# Patient Record
Sex: Female | Born: 1955 | Race: White | Smoking: Former smoker
Health system: Southern US, Community
[De-identification: ages and names within clinical notes are randomized; demographics above are authoritative.]

## PROBLEM LIST (undated history)

## (undated) DIAGNOSIS — E785 Hyperlipidemia, unspecified: Secondary | ICD-10-CM

## (undated) DIAGNOSIS — R011 Cardiac murmur, unspecified: Secondary | ICD-10-CM

## (undated) DIAGNOSIS — M8580 Other specified disorders of bone density and structure, unspecified site: Secondary | ICD-10-CM

## (undated) DIAGNOSIS — R7303 Prediabetes: Secondary | ICD-10-CM

## (undated) DIAGNOSIS — E538 Deficiency of other specified B group vitamins: Secondary | ICD-10-CM

## (undated) DIAGNOSIS — E559 Vitamin D deficiency, unspecified: Secondary | ICD-10-CM

## (undated) DIAGNOSIS — E669 Obesity, unspecified: Secondary | ICD-10-CM

## (undated) DIAGNOSIS — R7989 Other specified abnormal findings of blood chemistry: Secondary | ICD-10-CM

## (undated) DIAGNOSIS — R0989 Other specified symptoms and signs involving the circulatory and respiratory systems: Secondary | ICD-10-CM

## (undated) HISTORY — PX: TONSILECTOMY/ADENOIDECTOMY WITH MYRINGOTOMY: SHX6125

## (undated) HISTORY — DX: Obesity, unspecified: E66.9

## (undated) HISTORY — DX: Other specified disorders of bone density and structure, unspecified site: M85.80

## (undated) HISTORY — DX: Vitamin D deficiency, unspecified: E55.9

## (undated) HISTORY — PX: WISDOM TOOTH EXTRACTION: SHX21

## (undated) HISTORY — DX: Cardiac murmur, unspecified: R01.1

## (undated) HISTORY — PX: TUBAL LIGATION: SHX77

## (undated) HISTORY — DX: Other specified abnormal findings of blood chemistry: R79.89

## (undated) HISTORY — DX: Prediabetes: R73.03

## (undated) HISTORY — DX: Hyperlipidemia, unspecified: E78.5

## (undated) HISTORY — DX: Deficiency of other specified B group vitamins: E53.8

## (undated) HISTORY — DX: Other specified symptoms and signs involving the circulatory and respiratory systems: R09.89

---

## 1982-08-04 HISTORY — PX: OTHER SURGICAL HISTORY: SHX169

## 2009-08-04 DIAGNOSIS — M8580 Other specified disorders of bone density and structure, unspecified site: Secondary | ICD-10-CM

## 2009-08-04 HISTORY — DX: Other specified disorders of bone density and structure, unspecified site: M85.80

## 2013-07-04 HISTORY — PX: COLONOSCOPY: SHX174

## 2015-06-29 ENCOUNTER — Encounter: Payer: Self-pay | Admitting: Family Medicine

## 2015-10-14 DIAGNOSIS — R7303 Prediabetes: Secondary | ICD-10-CM

## 2015-10-14 DIAGNOSIS — R7989 Other specified abnormal findings of blood chemistry: Secondary | ICD-10-CM

## 2015-10-14 HISTORY — DX: Prediabetes: R73.03

## 2015-10-14 HISTORY — DX: Other specified abnormal findings of blood chemistry: R79.89

## 2016-11-01 DIAGNOSIS — R0989 Other specified symptoms and signs involving the circulatory and respiratory systems: Secondary | ICD-10-CM

## 2016-11-01 DIAGNOSIS — E669 Obesity, unspecified: Secondary | ICD-10-CM

## 2016-11-01 DIAGNOSIS — R011 Cardiac murmur, unspecified: Secondary | ICD-10-CM

## 2016-11-01 HISTORY — DX: Other specified symptoms and signs involving the circulatory and respiratory systems: R09.89

## 2016-11-01 HISTORY — DX: Cardiac murmur, unspecified: R01.1

## 2016-11-01 HISTORY — DX: Obesity, unspecified: E66.9

## 2016-11-07 ENCOUNTER — Encounter: Payer: Self-pay | Admitting: Family Medicine

## 2016-11-07 LAB — BASIC METABOLIC PANEL
BUN: 16 (ref 4–21)
CREATININE: 0.8 (ref 0.5–1.1)
Glucose: 85
Potassium: 4.8 (ref 3.4–5.3)
Sodium: 144 (ref 137–147)

## 2016-11-07 LAB — TSH: TSH: 1.78 (ref 0.41–5.90)

## 2016-11-07 LAB — CBC AND DIFFERENTIAL
HCT: 41 (ref 36–46)
Hemoglobin: 13.3 (ref 12.0–16.0)
Neutrophils Absolute: 2088
Platelets: 231 (ref 150–399)
WBC: 4.5

## 2016-11-07 LAB — LIPID PANEL
CHOLESTEROL: 227 — AB (ref 0–200)
HDL: 66 (ref 35–70)
LDL Cholesterol: 146
Triglycerides: 55 (ref 40–160)

## 2016-11-07 LAB — VITAMIN D 25 HYDROXY (VIT D DEFICIENCY, FRACTURES): Vit D, 25-Hydroxy: 31

## 2016-11-07 LAB — HEMOGLOBIN A1C: HEMOGLOBIN A1C: 5.4

## 2016-11-07 LAB — HEPATIC FUNCTION PANEL
ALT: 22 (ref 7–35)
AST: 24 (ref 13–35)
Bilirubin, Total: 0.7

## 2016-11-07 LAB — VITAMIN B12: Vitamin B-12: 511

## 2016-11-07 LAB — IRON,TIBC AND FERRITIN PANEL: Ferritin: 138

## 2018-05-05 ENCOUNTER — Encounter: Payer: Self-pay | Admitting: Family Medicine

## 2018-06-25 ENCOUNTER — Encounter: Payer: Self-pay | Admitting: Family Medicine

## 2018-06-25 ENCOUNTER — Ambulatory Visit (INDEPENDENT_AMBULATORY_CARE_PROVIDER_SITE_OTHER): Payer: BLUE CROSS/BLUE SHIELD | Admitting: Family Medicine

## 2018-06-25 VITALS — BP 128/62 | HR 69 | Temp 98.6°F | Ht 62.0 in | Wt 188.0 lb

## 2018-06-25 DIAGNOSIS — Z1159 Encounter for screening for other viral diseases: Secondary | ICD-10-CM

## 2018-06-25 DIAGNOSIS — E538 Deficiency of other specified B group vitamins: Secondary | ICD-10-CM

## 2018-06-25 DIAGNOSIS — Z0000 Encounter for general adult medical examination without abnormal findings: Secondary | ICD-10-CM

## 2018-06-25 DIAGNOSIS — Z862 Personal history of diseases of the blood and blood-forming organs and certain disorders involving the immune mechanism: Secondary | ICD-10-CM | POA: Diagnosis not present

## 2018-06-25 DIAGNOSIS — Z13220 Encounter for screening for lipoid disorders: Secondary | ICD-10-CM | POA: Diagnosis not present

## 2018-06-25 DIAGNOSIS — Z9189 Other specified personal risk factors, not elsewhere classified: Secondary | ICD-10-CM | POA: Diagnosis not present

## 2018-06-25 DIAGNOSIS — E559 Vitamin D deficiency, unspecified: Secondary | ICD-10-CM | POA: Diagnosis not present

## 2018-06-25 DIAGNOSIS — Z114 Encounter for screening for human immunodeficiency virus [HIV]: Secondary | ICD-10-CM | POA: Diagnosis not present

## 2018-06-25 DIAGNOSIS — E7800 Pure hypercholesterolemia, unspecified: Secondary | ICD-10-CM

## 2018-06-25 LAB — COMPREHENSIVE METABOLIC PANEL
ALT: 14 U/L (ref 0–35)
AST: 18 U/L (ref 0–37)
Albumin: 4.1 g/dL (ref 3.5–5.2)
Alkaline Phosphatase: 57 U/L (ref 39–117)
BUN: 19 mg/dL (ref 6–23)
CO2: 29 mEq/L (ref 19–32)
Calcium: 9.5 mg/dL (ref 8.4–10.5)
Chloride: 105 mEq/L (ref 96–112)
Creatinine, Ser: 0.89 mg/dL (ref 0.40–1.20)
GFR: 68.36 mL/min (ref >60.00)
Glucose, Bld: 133 mg/dL — ABNORMAL HIGH (ref 70–99)
Potassium: 4.2 mEq/L (ref 3.5–5.1)
Sodium: 142 mEq/L (ref 135–145)
Total Bilirubin: 0.4 mg/dL (ref 0.2–1.2)
Total Protein: 6.4 g/dL (ref 6.0–8.3)

## 2018-06-25 LAB — LIPID PANEL
Cholesterol: 225 mg/dL — ABNORMAL HIGH (ref 0–200)
HDL: 53.7 mg/dL (ref >39.00)
LDL Cholesterol: 142 mg/dL — ABNORMAL HIGH (ref 0–99)
NonHDL: 171.37
Total CHOL/HDL Ratio: 4
Triglycerides: 149 mg/dL (ref 0.0–149.0)
VLDL: 29.8 mg/dL (ref 0.0–40.0)

## 2018-06-25 LAB — CBC WITH DIFFERENTIAL/PLATELET
Basophils Absolute: 0 10*3/uL (ref 0.0–0.1)
Basophils Relative: 0.9 % (ref 0.0–3.0)
Eosinophils Absolute: 0.3 10*3/uL (ref 0.0–0.7)
Eosinophils Relative: 4.8 % (ref 0.0–5.0)
HCT: 39.1 % (ref 36.0–46.0)
Hemoglobin: 12.8 g/dL (ref 12.0–15.0)
Lymphocytes Relative: 39.4 % (ref 12.0–46.0)
Lymphs Abs: 2.1 10*3/uL (ref 0.7–4.0)
MCHC: 32.7 g/dL (ref 30.0–36.0)
MCV: 78.4 fl (ref 78.0–100.0)
Monocytes Absolute: 0.4 10*3/uL (ref 0.1–1.0)
Monocytes Relative: 6.9 % (ref 3.0–12.0)
Neutro Abs: 2.5 10*3/uL (ref 1.4–7.7)
Neutrophils Relative %: 48 % (ref 43.0–77.0)
Platelets: 241 10*3/uL (ref 150.0–400.0)
RBC: 4.99 Mil/uL (ref 3.87–5.11)
RDW: 13.8 % (ref 11.5–15.5)
WBC: 5.3 10*3/uL (ref 4.0–10.5)

## 2018-06-25 LAB — VITAMIN B12: Vitamin B-12: 313 pg/mL (ref 211–911)

## 2018-06-25 LAB — VITAMIN D 25 HYDROXY (VIT D DEFICIENCY, FRACTURES): VITD: 27.82 ng/mL — ABNORMAL LOW (ref 30.00–100.00)

## 2018-06-25 NOTE — Progress Notes (Signed)
Subjective:    Carla Washington is a 62 y.o. female and is here for a comprehensive physical exam.  Health Maintenance Due  Topic Date Due  . TETANUS/TDAP  09/15/1975  . PAP SMEAR  09/14/1977  . COLONOSCOPY  09/14/2006   No current outpatient medications on file.  PMHx, SurgHx, SocialHx, Medications, and Allergies were reviewed in the Visit Navigator and updated as appropriate.   History reviewed. No pertinent past medical history.   Past Surgical History:  Procedure Laterality Date  . CESAREAN SECTION     History reviewed. No pertinent family history.  Social History   Tobacco Use  . Smoking status: Former Smoker    Packs/day: 0.01    Last attempt to quit: 06/25/1993    Years since quitting: 25.0  . Smokeless tobacco: Never Used  Substance Use Topics  . Alcohol use: Never    Frequency: Never  . Drug use: Never    Review of Systems:   Pertinent items are noted in the HPI. Otherwise, ROS is negative.  Objective:   BP 128/62   Pulse 69   Temp 98.6 F (37 C) (Oral)   Ht 5\' 2"  (1.575 m)   Wt 188 lb (85.3 kg)   SpO2 97%   BMI 34.39 kg/m   General appearance: alert, cooperative and appears stated age. Head: normocephalic, without obvious abnormality, atraumatic. Neck: no adenopathy, supple, symmetrical, trachea midline; thyroid not enlarged, symmetric, no tenderness/mass/nodules. Lungs: clear to auscultation bilaterally. Heart: regular rate and rhythm Abdomen: soft, non-tender; no masses,  no organomegaly. Extremities: extremities normal, atraumatic, no cyanosis or edema. Skin: skin color, texture, turgor normal, no rashes or lesions. Lymph: cervical, supraclavicular, and axillary nodes normal; no abnormal inguinal nodes palpated. Neurologic: grossly normal.  Results for orders placed or performed in visit on 06/25/18  CBC with Differential/Platelet  Result Value Ref Range   WBC 5.3 4.0 - 10.5 K/uL   RBC 4.99 3.87 - 5.11 Mil/uL   Hemoglobin 12.8 12.0  - 15.0 g/dL   HCT 09.839.1 11.936.0 - 14.746.0 %   MCV 78.4 78.0 - 100.0 fl   MCHC 32.7 30.0 - 36.0 g/dL   RDW 82.913.8 56.211.5 - 13.015.5 %   Platelets 241.0 150.0 - 400.0 K/uL   Neutrophils Relative % 48.0 43.0 - 77.0 %   Lymphocytes Relative 39.4 12.0 - 46.0 %   Monocytes Relative 6.9 3.0 - 12.0 %   Eosinophils Relative 4.8 0.0 - 5.0 %   Basophils Relative 0.9 0.0 - 3.0 %   Neutro Abs 2.5 1.4 - 7.7 K/uL   Lymphs Abs 2.1 0.7 - 4.0 K/uL   Monocytes Absolute 0.4 0.1 - 1.0 K/uL   Eosinophils Absolute 0.3 0.0 - 0.7 K/uL   Basophils Absolute 0.0 0.0 - 0.1 K/uL  Comprehensive metabolic panel  Result Value Ref Range   Sodium 142 135 - 145 mEq/L   Potassium 4.2 3.5 - 5.1 mEq/L   Chloride 105 96 - 112 mEq/L   CO2 29 19 - 32 mEq/L   Glucose, Bld 133 (H) 70 - 99 mg/dL   BUN 19 6 - 23 mg/dL   Creatinine, Ser 8.650.89 0.40 - 1.20 mg/dL   Total Bilirubin 0.4 0.2 - 1.2 mg/dL   Alkaline Phosphatase 57 39 - 117 U/L   AST 18 0 - 37 U/L   ALT 14 0 - 35 U/L   Total Protein 6.4 6.0 - 8.3 g/dL   Albumin 4.1 3.5 - 5.2 g/dL   Calcium 9.5 8.4 -  10.5 mg/dL   GFR 16.10 >96.04 mL/min  Lipid panel  Result Value Ref Range   Cholesterol 225 (H) 0 - 200 mg/dL   Triglycerides 540.9 0.0 - 149.0 mg/dL   HDL 81.19 >14.78 mg/dL   VLDL 29.5 0.0 - 62.1 mg/dL   LDL Cholesterol 308 (H) 0 - 99 mg/dL   Total CHOL/HDL Ratio 4    NonHDL 171.37   VITAMIN D 25 Hydroxy (Vit-D Deficiency, Fractures)  Result Value Ref Range   VITD 27.82 (L) 30.00 - 100.00 ng/mL  Vitamin B12  Result Value Ref Range   Vitamin B-12 313 211 - 911 pg/mL  Iron, TIBC and Ferritin Panel  Result Value Ref Range   Iron 69 45 - 160 mcg/dL   TIBC 657 846 - 962 mcg/dL (calc)   %SAT 24 16 - 45 % (calc)   Ferritin 87 16 - 288 ng/mL  HIV Antibody (routine testing w rflx)  Result Value Ref Range   HIV 1&2 Ab, 4th Generation NON-REACTIVE NON-REACTI  Hepatitis C antibody  Result Value Ref Range   Hepatitis C Ab NON-REACTIVE NON-REACTI   SIGNAL TO CUT-OFF 0.01 <1.00     Assessment/Plan:   Tyyne was seen today for establish care.  Diagnoses and all orders for this visit:  Routine physical examination  Encounter for hepatitis C virus screening test for high risk patient -     Hepatitis C antibody  Screening for HIV (human immunodeficiency virus) -     HIV Antibody (routine testing w rflx)  Screening for lipid disorders -     Lipid panel  History of anemia -     CBC with Differential/Platelet -     Comprehensive metabolic panel -     Vitamin B12 -     Iron, TIBC and Ferritin Panel  Vitamin D deficiency -     VITAMIN D 25 Hydroxy (Vit-D Deficiency, Fractures)  Patient Counseling: [x]    Nutrition: Stressed importance of moderation in sodium/caffeine intake, saturated fat and cholesterol, caloric balance, sufficient intake of fresh fruits, vegetables, fiber, calcium, iron, and 1 mg of folate supplement per day (for females capable of pregnancy).  [x]    Stressed the importance of regular exercise.   [x]    Substance Abuse: Discussed cessation/primary prevention of tobacco, alcohol, or other drug use; driving or other dangerous activities under the influence; availability of treatment for abuse.   [x]    Injury prevention: Discussed safety belts, safety helmets, smoke detector, smoking near bedding or upholstery.   [x]    Sexuality: Discussed sexually transmitted diseases, partner selection, use of condoms, avoidance of unintended pregnancy  and contraceptive alternatives.  [x]    Dental health: Discussed importance of regular tooth brushing, flossing, and dental visits.  [x]    Health maintenance and immunizations reviewed. Please refer to Health maintenance section.   Helane Rima, DO [facility]

## 2018-06-26 LAB — HEPATITIS C ANTIBODY
Hepatitis C Ab: NONREACTIVE
SIGNAL TO CUT-OFF: 0.01 (ref <1.00)

## 2018-06-26 LAB — IRON,TIBC AND FERRITIN PANEL
%SAT: 24 % (calc) (ref 16–45)
Ferritin: 87 ng/mL (ref 16–288)
Iron: 69 ug/dL (ref 45–160)
TIBC: 291 mcg/dL (calc) (ref 250–450)

## 2018-06-26 LAB — HIV ANTIBODY (ROUTINE TESTING W REFLEX): HIV 1&2 Ab, 4th Generation: NONREACTIVE

## 2018-06-27 ENCOUNTER — Encounter: Payer: Self-pay | Admitting: Family Medicine

## 2018-06-27 DIAGNOSIS — E559 Vitamin D deficiency, unspecified: Secondary | ICD-10-CM

## 2018-06-27 DIAGNOSIS — E538 Deficiency of other specified B group vitamins: Secondary | ICD-10-CM

## 2018-06-27 DIAGNOSIS — E785 Hyperlipidemia, unspecified: Secondary | ICD-10-CM

## 2018-06-27 HISTORY — DX: Hyperlipidemia, unspecified: E78.5

## 2018-06-27 HISTORY — DX: Vitamin D deficiency, unspecified: E55.9

## 2018-06-27 HISTORY — DX: Deficiency of other specified B group vitamins: E53.8

## 2018-06-30 ENCOUNTER — Encounter: Payer: Self-pay | Admitting: Family Medicine

## 2018-07-07 ENCOUNTER — Encounter: Payer: Self-pay | Admitting: Family Medicine

## 2018-10-24 ENCOUNTER — Telehealth: Payer: BLUE CROSS/BLUE SHIELD | Admitting: Nurse Practitioner

## 2018-10-24 ENCOUNTER — Encounter: Payer: Self-pay | Admitting: Family Medicine

## 2018-10-24 DIAGNOSIS — R05 Cough: Secondary | ICD-10-CM

## 2018-10-24 DIAGNOSIS — R059 Cough, unspecified: Secondary | ICD-10-CM

## 2018-10-24 MED ORDER — BENZONATATE 100 MG PO CAPS: 100.0000 mg | ORAL_CAPSULE | Freq: Three times a day (TID) | ORAL | 0 refills | Status: DC | PRN

## 2018-10-24 NOTE — Progress Notes (Signed)
E-Visit for Corona Virus Screening  Based on your current symptoms, you may very well have the virus, however your symptoms are mild. Currently, not all patients are being tested. If the symptoms are mild and there is not a known exposure, performing the test is not indicated.  Coronavirus disease 2019 (COVID-19) is a respiratory illness that can spread from person to person. The virus that causes COVID-19 is a new virus that was first identified in the country of [place] but is now found in multiple other countries and has spread to the [place].  Symptoms associated with the virus are mild to severe fever, cough, and shortness of breath. There is currently no vaccine to protect against COVID-19, and there is no specific antiviral treatment for the virus.   To be considered HIGH RISK for Coronavirus (COVID-19), you have to meet the following criteria:  . Traveled to [place], [place], [place], [place] or [place]; or in the [place] to [place], [place], [place], or [place]; and have fever, cough, and shortness of breath within the last 2 weeks of travel OR  . Been in close contact with a person diagnosed with COVID-19 within the last 2 weeks and have fever, cough, and shortness of breath  . IF YOU DO NOT MEET THESE CRITERIA, YOU ARE CONSIDERED LOW RISK FOR COVID-19.   It is vitally important that if you feel that you have an infection such as this virus or any other virus that you stay home and away from places where you may spread it to others.  You should self-quarantine for 14 days if you have symptoms that could potentially be coronavirus and avoid contact with people age 65 and older.   You can use medication such as A prescription cough medication called Tessalon Perles 100 mg. You may take 1-2 capsules every 8 hours as needed for cough  You may also take acetaminophen (Tylenol) as needed for fever.   Reduce your risk of any infection by using the same precautions used for  avoiding the common cold or flu:  . Wash your hands often with soap and warm water for at least 20 seconds.  If soap and water are not readily available, use an alcohol-based hand sanitizer with at least 60% alcohol.  . If coughing or sneezing, cover your mouth and nose by coughing or sneezing into the elbow areas of your shirt or coat, into a tissue or into your sleeve (not your hands). . Avoid shaking hands with others and consider head nods or verbal greetings only. . Avoid touching your eyes, nose, or mouth with unwashed hands.  . Avoid close contact with people who are sick. . Avoid places or events with large numbers of people in one location, like concerts or sporting events. . Carefully consider travel plans you have or are making. . If you are planning any travel outside or inside the [place], visit the CDC's Travelers' Health webpage for the latest health notices. . If you have some symptoms but not all symptoms, continue to monitor at home and seek medical attention if your symptoms worsen. . If you are having a medical emergency, call 911.  HOME CARE . Only take medications as instructed by your medical team. . Drink plenty of fluids and get plenty of rest. . A steam or ultrasonic humidifier can help if you have congestion.   GET HELP RIGHT AWAY IF: . You develop worsening fever. . You become short of breath . You cough   up blood. . Your symptoms become more severe MAKE SURE YOU   Understand these instructions.  Will watch your condition.  Will get help right away if you are not doing well or get worse.  Your e-visit answers were reviewed by a board certified advanced clinical practitioner to complete your personal care plan.  Depending on the condition, your plan could have included both over the counter or prescription medications.  If there is a problem please reply once you have received a response from your provider. Your safety is important to us.  If you have drug  allergies check your prescription carefully.    You can use MyChart to ask questions about today's visit, request a non-urgent call back, or ask for a work or school excuse for 24 hours related to this e-Visit. If it has been greater than 24 hours you will need to follow up with your provider, or enter a new e-Visit to address those concerns. You will get an e-mail in the next two days asking about your experience.  I hope that your e-visit has been valuable and will speed your recovery. Thank you for using e-visits.  5 minutes spent reviewing and documenting in chart.  

## 2019-03-10 DIAGNOSIS — M2042 Other hammer toe(s) (acquired), left foot: Secondary | ICD-10-CM | POA: Diagnosis not present

## 2019-03-10 DIAGNOSIS — S9032XA Contusion of left foot, initial encounter: Secondary | ICD-10-CM | POA: Diagnosis not present

## 2019-03-10 DIAGNOSIS — M2041 Other hammer toe(s) (acquired), right foot: Secondary | ICD-10-CM | POA: Diagnosis not present

## 2019-06-16 DIAGNOSIS — R002 Palpitations: Secondary | ICD-10-CM | POA: Diagnosis not present

## 2019-06-16 DIAGNOSIS — E785 Hyperlipidemia, unspecified: Secondary | ICD-10-CM | POA: Diagnosis not present

## 2019-06-16 DIAGNOSIS — R0683 Snoring: Secondary | ICD-10-CM | POA: Insufficient documentation

## 2019-06-27 ENCOUNTER — Encounter: Payer: Self-pay | Admitting: Family Medicine

## 2019-07-15 ENCOUNTER — Ambulatory Visit (INDEPENDENT_AMBULATORY_CARE_PROVIDER_SITE_OTHER): Payer: BC Managed Care – PPO | Admitting: Family Medicine

## 2019-07-15 ENCOUNTER — Encounter: Payer: Self-pay | Admitting: Family Medicine

## 2019-07-15 ENCOUNTER — Other Ambulatory Visit: Payer: Self-pay

## 2019-07-15 VITALS — BP 106/74 | HR 60 | Temp 97.3°F | Ht 62.0 in | Wt 200.6 lb

## 2019-07-15 DIAGNOSIS — Z0000 Encounter for general adult medical examination without abnormal findings: Secondary | ICD-10-CM

## 2019-07-15 DIAGNOSIS — E559 Vitamin D deficiency, unspecified: Secondary | ICD-10-CM

## 2019-07-15 DIAGNOSIS — E538 Deficiency of other specified B group vitamins: Secondary | ICD-10-CM | POA: Diagnosis not present

## 2019-07-15 NOTE — Patient Instructions (Signed)

## 2019-07-15 NOTE — Progress Notes (Signed)
Patient: Carla Washington MRN: [id number] DOB: May 03, 1956 PCP: Briscoe Deutscher, DO     Subjective:  Chief Complaint  Patient presents with  . Transitions Of Care  . Annual Exam    HPI: The patient is a 63 y.o. female who presents today for annual exam. She denies any changes to past medical history. There have been no recent hospitalizations. They are following a well balanced diet and exercise plan. Weight has been increasing steadily. No complaints today.   No family history of breast or colon cancer in first degree relative.   Saw a cardiologist through [facility] recently. He is doing an echo stress exercise test on 08/15/18. He also has addressed cholesterol with her. +FH of deadly MI in her father at age 62 years. Mother was 67 years of age and died of alzheimers.   There is no immunization history on file for this patient.  Colonoscopy:07/2013 Mammogram: has been 4 years... she does do self exams.  Pap smear: 10/20/2016 Flu shot: declines Tdap: declines  Review of Systems  Constitutional: Negative for fatigue.  HENT: Positive for rhinorrhea. Negative for congestion, postnasal drip and sore throat.   Eyes: Negative for visual disturbance.  Respiratory: Negative for shortness of breath.   Cardiovascular: Positive for palpitations. Negative for chest pain and leg swelling.       Palpitations "for years" Followed by Cardiology for this.  Scheduled to have stress test in 08/2019  Gastrointestinal: Negative for abdominal pain, constipation, diarrhea, nausea and vomiting.  Endocrine: Negative for cold intolerance, heat intolerance, polydipsia and polyuria.  Genitourinary: Negative for dysuria, frequency and urgency.  Musculoskeletal: Negative for arthralgias, back pain, myalgias and neck pain.  Skin: Negative for rash.  Neurological: Negative for dizziness and headaches.  Psychiatric/Behavioral: Negative for sleep disturbance.    Allergies Patient is allergic to  penicillins.  Past Medical History Patient  has a past medical history of Abnormal C-reactive protein (10/14/2015), B12 deficiency (06/27/2018), Carotid bruit (11/01/2016), HLD (hyperlipidemia) (06/27/2018), Obesity (11/01/2016), Osteopenia (08/04/2009), Prediabetes (32/35/5732), Systolic murmur (20/25/4270), and Vitamin D deficiency (06/27/2018).  Surgical History Patient  has a past surgical history that includes Cesarean section; Tonsilectomy/adenoidectomy with myringotomy; Colonoscopy (07/04/2013); and removal of ovarian cyst (08/04/1982).  Family History Pateint's family history includes Alzheimer's disease in her mother; Heart disease in her father.  Social History Patient  reports that she quit smoking about 26 years ago. She smoked 0.01 packs per day. She has never used smokeless tobacco. She reports that she does not drink alcohol or use drugs.    Objective: Vitals:   07/15/19 1305  BP: 106/74  Pulse: 60  Temp: (!) 97.3 F (36.3 C)  TempSrc: Skin  SpO2: 98%  Weight: 200 lb 9.6 oz (91 kg)  Height: 5\' 2"  (1.575 m)    Body mass index is 36.69 kg/m.  Physical Exam Vitals reviewed.  Constitutional:      Appearance: Normal appearance. She is well-developed. She is obese.  HENT:     Head: Normocephalic and atraumatic.     Right Ear: Tympanic membrane, ear canal and external ear normal.     Left Ear: Tympanic membrane, ear canal and external ear normal.     Ears:     Comments: Scarring bilateral TM    Nose: Nose normal.     Mouth/Throat:     Mouth: Mucous membranes are moist.  Eyes:     Conjunctiva/sclera: Conjunctivae normal.     Pupils: Pupils are equal, round, and reactive to light.  Neck:  Thyroid: No thyromegaly.     Vascular: No carotid bruit.  Cardiovascular:     Rate and Rhythm: Normal rate and regular rhythm.     Pulses: Normal pulses.     Heart sounds: Normal heart sounds. No murmur.  Pulmonary:     Effort: Pulmonary effort is normal.     Breath  sounds: Normal breath sounds.  Abdominal:     General: Bowel sounds are normal. There is no distension.     Palpations: Abdomen is soft.     Tenderness: There is no abdominal tenderness.  Musculoskeletal:     Cervical back: Normal range of motion and neck supple.  Lymphadenopathy:     Cervical: No cervical adenopathy.  Skin:    General: Skin is warm and dry.     Capillary Refill: Capillary refill takes less than 2 seconds.     Findings: No rash.  Neurological:     General: No focal deficit present.     Mental Status: She is alert and oriented to person, place, and time.     Cranial Nerves: No cranial nerve deficit.     Coordination: Coordination normal.     Deep Tendon Reflexes: Reflexes normal.  Psychiatric:        Mood and Affect: Mood normal.        Behavior: Behavior normal.    Depression screen [facility] 2/9 07/15/2019  Decreased Interest 0  Down, Depressed, Hopeless 0  PHQ - 2 Score 0       Assessment/plan: 1. Annual physical exam Routine fasting labs. She declines a lot of the HM issues. Understands risks. Will come back for labs. Followed by cardiology for palpitations. Continue to work on weight loss and exercise.  Patient counseling [x]    Nutrition: Stressed importance of moderation in sodium/caffeine intake, saturated fat and cholesterol, caloric balance, sufficient intake of fresh fruits, vegetables, fiber, calcium, iron, and 1 mg of folate supplement per day (for females capable of pregnancy).  [x]    Stressed the importance of regular exercise.   []    Substance Abuse: Discussed cessation/primary prevention of tobacco, alcohol, or other drug use; driving or other dangerous activities under the influence; availability of treatment for abuse.   [x]    Injury prevention: Discussed safety belts, safety helmets, smoke detector, smoking near bedding or upholstery.   [x]    Sexuality: Discussed sexually transmitted diseases, partner selection, use of condoms, avoidance of  unintended pregnancy  and contraceptive alternatives.  [x]    Dental health: Discussed importance of regular tooth brushing, flossing, and dental visits.  [x]    Health maintenance and immunizations reviewed. Please refer to Health maintenance section.     2. B12 deficiency Recheck   3. Vitamin D deficiency Recheck   This visit occurred during the SARS-CoV-2 public health emergency.  Safety protocols were in place, including screening questions prior to the visit, additional usage of staff PPE, and extensive cleaning of exam room while observing appropriate contact time as indicated for disinfecting solutions.    Return in about 1 year (around 07/14/2020).    , MD [facility]  07/15/2019

## 2019-07-18 ENCOUNTER — Other Ambulatory Visit: Payer: BC Managed Care – PPO

## 2019-12-16 ENCOUNTER — Encounter: Payer: Self-pay | Admitting: Family Medicine

## 2019-12-16 DIAGNOSIS — Z20822 Contact with and (suspected) exposure to covid-19: Secondary | ICD-10-CM | POA: Diagnosis not present

## 2019-12-18 DIAGNOSIS — L246 Irritant contact dermatitis due to food in contact with skin: Secondary | ICD-10-CM | POA: Diagnosis not present

## 2019-12-19 ENCOUNTER — Other Ambulatory Visit: Payer: Self-pay | Admitting: Family Medicine

## 2019-12-19 DIAGNOSIS — E782 Mixed hyperlipidemia: Secondary | ICD-10-CM

## 2019-12-19 DIAGNOSIS — E538 Deficiency of other specified B group vitamins: Secondary | ICD-10-CM

## 2019-12-19 DIAGNOSIS — E559 Vitamin D deficiency, unspecified: Secondary | ICD-10-CM

## 2019-12-20 ENCOUNTER — Other Ambulatory Visit (INDEPENDENT_AMBULATORY_CARE_PROVIDER_SITE_OTHER): Payer: BC Managed Care – PPO

## 2019-12-20 ENCOUNTER — Other Ambulatory Visit: Payer: Self-pay

## 2019-12-20 DIAGNOSIS — E538 Deficiency of other specified B group vitamins: Secondary | ICD-10-CM

## 2019-12-20 DIAGNOSIS — E782 Mixed hyperlipidemia: Secondary | ICD-10-CM | POA: Diagnosis not present

## 2019-12-20 DIAGNOSIS — E559 Vitamin D deficiency, unspecified: Secondary | ICD-10-CM | POA: Diagnosis not present

## 2019-12-20 LAB — LIPID PANEL
Cholesterol: 225 mg/dL — ABNORMAL HIGH (ref 0–200)
HDL: 50.3 mg/dL (ref >39.00)
LDL Cholesterol: 155 mg/dL — ABNORMAL HIGH (ref 0–99)
NonHDL: 175.1
Total CHOL/HDL Ratio: 4
Triglycerides: 102 mg/dL (ref 0.0–149.0)
VLDL: 20.4 mg/dL (ref 0.0–40.0)

## 2019-12-20 LAB — CBC WITH DIFFERENTIAL/PLATELET
Basophils Absolute: 0 10*3/uL (ref 0.0–0.1)
Basophils Relative: 0.8 % (ref 0.0–3.0)
Eosinophils Absolute: 0.2 10*3/uL (ref 0.0–0.7)
Eosinophils Relative: 3.4 % (ref 0.0–5.0)
HCT: 40.5 % (ref 36.0–46.0)
Hemoglobin: 13.2 g/dL (ref 12.0–15.0)
Lymphocytes Relative: 49.3 % — ABNORMAL HIGH (ref 12.0–46.0)
Lymphs Abs: 2.6 10*3/uL (ref 0.7–4.0)
MCHC: 32.6 g/dL (ref 30.0–36.0)
MCV: 78.8 fl (ref 78.0–100.0)
Monocytes Absolute: 0.5 10*3/uL (ref 0.1–1.0)
Monocytes Relative: 8.9 % (ref 3.0–12.0)
Neutro Abs: 2 10*3/uL (ref 1.4–7.7)
Neutrophils Relative %: 37.6 % — ABNORMAL LOW (ref 43.0–77.0)
Platelets: 206 10*3/uL (ref 150.0–400.0)
RBC: 5.14 Mil/uL — ABNORMAL HIGH (ref 3.87–5.11)
RDW: 14.1 % (ref 11.5–15.5)
WBC: 5.2 10*3/uL (ref 4.0–10.5)

## 2019-12-20 LAB — COMPREHENSIVE METABOLIC PANEL
ALT: 10 U/L (ref 0–35)
AST: 15 U/L (ref 0–37)
Albumin: 4.3 g/dL (ref 3.5–5.2)
Alkaline Phosphatase: 44 U/L (ref 39–117)
BUN: 23 mg/dL (ref 6–23)
CO2: 30 mEq/L (ref 19–32)
Calcium: 9.6 mg/dL (ref 8.4–10.5)
Chloride: 103 mEq/L (ref 96–112)
Creatinine, Ser: 0.81 mg/dL (ref 0.40–1.20)
GFR: 71.35 mL/min (ref >60.00)
Glucose, Bld: 92 mg/dL (ref 70–99)
Potassium: 4.2 mEq/L (ref 3.5–5.1)
Sodium: 138 mEq/L (ref 135–145)
Total Bilirubin: 0.5 mg/dL (ref 0.2–1.2)
Total Protein: 6.6 g/dL (ref 6.0–8.3)

## 2019-12-21 LAB — VITAMIN B12: Vitamin B-12: 482 pg/mL (ref 211–911)

## 2019-12-21 LAB — VITAMIN D 25 HYDROXY (VIT D DEFICIENCY, FRACTURES): VITD: 95.22 ng/mL (ref 30.00–100.00)

## 2019-12-22 ENCOUNTER — Other Ambulatory Visit: Payer: Self-pay

## 2019-12-22 ENCOUNTER — Ambulatory Visit (INDEPENDENT_AMBULATORY_CARE_PROVIDER_SITE_OTHER): Payer: BC Managed Care – PPO | Admitting: Family Medicine

## 2019-12-22 ENCOUNTER — Encounter: Payer: Self-pay | Admitting: Family Medicine

## 2019-12-22 VITALS — BP 112/80 | HR 76 | Temp 97.6°F | Ht 62.0 in | Wt 188.6 lb

## 2019-12-22 DIAGNOSIS — L309 Dermatitis, unspecified: Secondary | ICD-10-CM

## 2019-12-22 MED ORDER — EPINEPHRINE 0.3 MG/0.3ML IJ SOAJ: 0.3000 mg | INTRAMUSCULAR | 0 refills | Status: DC | PRN

## 2019-12-22 MED ORDER — TRIAMCINOLONE ACETONIDE 0.1 % EX CREA
1.0000 | TOPICAL_CREAM | Freq: Two times a day (BID) | CUTANEOUS | 0 refills | Status: DC
Start: 2019-12-22 — End: 2020-11-07

## 2019-12-22 NOTE — Patient Instructions (Signed)
1) I sent in steroid cream to use on legs/arms/hands. Do not use on face!   2) you can use hydrocortisone cream over the counter on face  3) never use neosporin! Can cause contact dermatitis. If need an antibiotic use polysporin.   4) labs all good. Work on cardio to get LDL lower.   Overall look great!!!  Dr. Artis Flock
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
## 2020-03-26 ENCOUNTER — Encounter: Payer: Self-pay | Admitting: Family Medicine

## 2020-04-02 ENCOUNTER — Encounter: Payer: Self-pay | Admitting: Family Medicine

## 2020-04-04 ENCOUNTER — Other Ambulatory Visit: Payer: Self-pay

## 2020-04-04 ENCOUNTER — Ambulatory Visit (INDEPENDENT_AMBULATORY_CARE_PROVIDER_SITE_OTHER): Payer: BC Managed Care – PPO | Admitting: Family Medicine

## 2020-04-04 ENCOUNTER — Encounter: Payer: Self-pay | Admitting: Family Medicine

## 2020-04-04 VITALS — BP 124/78 | HR 62 | Temp 98.0°F | Ht 62.0 in | Wt 198.0 lb

## 2020-04-04 DIAGNOSIS — M7121 Synovial cyst of popliteal space [Baker], right knee: Secondary | ICD-10-CM

## 2020-04-04 LAB — D-DIMER, QUANTITATIVE: D-Dimer, Quant: 0.51 mcg/mL FEU — ABNORMAL HIGH (ref <0.50)

## 2020-04-04 NOTE — Progress Notes (Signed)
Patient: Carla Washington MRN: [id number] DOB: 09/12/1955 PCP: Orland Mustard, MD     Subjective:  Chief Complaint  Patient presents with  . Knee Pain  . Joint Swelling    HPI: The patient is a 64 y.o. female who presents today for right knee pain, and calf swelling.  She states this started about one week ago. She has never had an issue with her knee before. Last Wednesday she was cutting bushes and got bit my mosquitoes and remembers scratching. The next day she woke up with a stiff and sore knee. She also states it got swollen. She has done ice and heat, naproxen every 8 hours. She has no pain sitting down. She has soreness behind her knee and in the front. She has pain with extension and flexion. She has heard popping sounds. She did go for a walk around her block last night and noticed her knee was really swollen after the walk. She has no past trauma or surgery to the knee. She has no hx of smoking, OCP, lupus, or long travel.   She has an appointment Tuesday with a DO.    Review of Systems  Cardiovascular: Positive for leg swelling.  Musculoskeletal: Positive for arthralgias and joint swelling.    Allergies Patient is allergic to penicillins.  Past Medical History Patient  has a past medical history of Abnormal C-reactive protein (10/14/2015), B12 deficiency (06/27/2018), Carotid bruit (11/01/2016), HLD (hyperlipidemia) (06/27/2018), Obesity (11/01/2016), Osteopenia (08/04/2009), Prediabetes (10/14/2015), Systolic murmur (75/64/3329), and Vitamin D deficiency (06/27/2018).  Surgical History Patient  has a past surgical history that includes Cesarean section; Tonsilectomy/adenoidectomy with myringotomy; Colonoscopy (07/04/2013); and removal of ovarian cyst (08/04/1982).  Family History Pateint's family history includes Alzheimer's disease in her mother; Heart disease in her father.  Social History Patient  reports that she quit smoking about 26 years ago. She smoked 0.01  packs per day. She has never used smokeless tobacco. She reports that she does not drink alcohol and does not use drugs.    Objective: Vitals:   04/04/20 1358  BP: 124/78  Pulse: 62  Temp: 98 F (36.7 C)  TempSrc: Temporal  SpO2: 99%  Weight: 198 lb (89.8 kg)  Height: 5\' 2"  (1.575 m)    Body mass index is 36.21 kg/m.  Physical Exam Vitals reviewed.  Constitutional:      General: She is not in acute distress.    Appearance: Normal appearance. She is obese. She is not ill-appearing.  HENT:     Head: Normocephalic and atraumatic.  Cardiovascular:     Rate and Rhythm: Normal rate and regular rhythm.  Pulmonary:     Effort: Pulmonary effort is normal.     Breath sounds: Normal breath sounds.  Musculoskeletal:        General: Swelling and tenderness present.     Comments: Right knee: TTP in popliteal fossa with edema. No TTP over anterior knee. Negative draw, negative mcmurrays. No pain with varus or valgus strain. Negative homan sign.  Edema from knee to ankle. No erythema or warmth. She can extend to just about 180 degrees with some pain in popliteal fossa and can flex past 90 degrees.  Left calf: 16: right calf 17.2"  Neurological:     General: No focal deficit present.     Mental Status: She is alert and oriented to person, place, and time.  Psychiatric:        Mood and Affect: Mood normal.        Behavior:  Behavior normal.    Wells criteria: -1    Assessment/plan: 1. Baker's cyst of knee, right Very low clinical suspicion for DVT. -1 wells criteria. Will check stat d-dimer. I do think she has a bakers cyst. Does not want xray as she already has appointment scheduled with sports medicine on Tuesday. Will do pull on brace/ice and voltaren gel. F/u with sports med. If d-dimer elevated will send for stat ultrasound today or tomorrow.  - D-Dimer, Quantitative; Future    This visit occurred during the SARS-CoV-2 public health emergency.  Safety protocols were in place,  including screening questions prior to the visit, additional usage of staff PPE, and extensive cleaning of exam room while observing appropriate contact time as indicated for disinfecting solutions.     Return if symptoms worsen or fail to improve.   Orland Mustard, MD [facility]   04/04/2020

## 2020-04-04 NOTE — Patient Instructions (Addendum)
voltaren gel over the counter four times a day Get a neoprene brace as well  Ordering stat lab that will help with clotting. Score for dvt is very low. If this test is elevated we will send for stat ultrasound.   Baker Cyst  A Baker cyst, also called a popliteal cyst, is a growth that forms at the back of the knee. The cyst forms when the fluid-filled sac (bursa) that cushions the knee joint becomes enlarged. What are the causes? In most cases, a Baker cyst results from another knee problem that causes swelling inside the knee. This makes the fluid inside the knee joint (synovial fluid) flow into the bursa behind the knee, causing the bursa to enlarge. What increases the risk? You may be more likely to develop a Baker cyst if you already have a knee problem, such as:  A tear in cartilage that cushions the knee joint (meniscal tear).  A tear in the tissues that connect the bones of the knee joint (ligament tear).  Knee swelling from osteoarthritis, rheumatoid arthritis, or gout. What are the signs or symptoms? The main symptom of this condition is a lump behind the knee. This may be the only symptom of the condition. The lump may be painful, especially when the knee is straightened. If the lump is painful, the pain may come and go. The knee may also be stiff. Symptoms may quickly get more severe if the cyst breaks open (ruptures). If the cyst ruptures, you may feel the following in your knee and calf:  Sudden or worsening pain.  Swelling.  Bruising.  Redness in the calf. A Baker cyst does not always cause symptoms. How is this diagnosed? This condition may be diagnosed based on your symptoms and medical history. Your health care provider will also do a physical exam. This may include:  Feeling the cyst to check whether it is tender.  Checking your knee for signs of another knee condition that causes swelling. You may have imaging tests, such  as:  X-rays.  MRI.  Ultrasound. How is this treated? A Baker cyst that is not painful may go away without treatment. If the cyst gets large or painful, it will likely get better if the underlying knee problem is treated. If needed, treatment for a Baker cyst may include:  Resting.  Keeping weight off of the knee. This means not leaning on the knee to support your body weight.  Taking NSAIDs, such as ibuprofen, to reduce pain and swelling.  Having a procedure to drain the fluid from the cyst with a needle (aspiration). You may also get an injection of a medicine that reduces swelling (steroid).  Having surgery. This may be needed if other treatments do not work. This usually involves correcting knee damage and removing the cyst. Follow these instructions at home:  Activity  Rest as told by your health care provider.  Avoid activities that make pain or swelling worse.  Return to your normal activities as told by your health care provider. Ask your health care provider what activities are safe for you.  Do not use the injured limb to support your body weight until your health care provider says that you can. Use crutches as told by your health care provider. General instructions  Take over-the-counter and prescription medicines only as told by your health care provider.  Keep all follow-up visits as told by your health care provider. This is important. Contact a health care provider if:  You have knee pain,  stiffness, or swelling that does not get better. Get help right away if:  You have sudden or worsening pain and swelling in your calf area. Summary  A Baker cyst, also called a popliteal cyst, is a growth that forms at the back of the knee.  In most cases, a Baker cyst results from another knee problem that causes swelling inside the knee.  A Baker cyst that is not painful may go away without treatment.  If needed, treatment for a Baker cyst may include resting,  keeping weight off of the knee, medicines, or draining fluid from the cyst.  Surgery may be needed if other treatments are not effective. This information is not intended to replace advice given to you by your health care provider. Make sure you discuss any questions you have with your health care provider. Document Revised: 12/03/2018 Document Reviewed: 12/03/2018 Elsevier Patient Education  2020 [employer].

## 2020-04-05 ENCOUNTER — Telehealth: Payer: Self-pay

## 2020-04-05 NOTE — Telephone Encounter (Signed)
Pt returned my call for lab results. Gave lab result message via Dr Blair Heys note. Pt voiced understanding, and will monitor for redness and swelling.

## 2020-04-05 NOTE — Telephone Encounter (Signed)
Lvm for pt to call the office back for lab results.

## 2020-04-06 ENCOUNTER — Other Ambulatory Visit: Payer: Self-pay | Admitting: Family Medicine

## 2020-04-06 ENCOUNTER — Ambulatory Visit (HOSPITAL_BASED_OUTPATIENT_CLINIC_OR_DEPARTMENT_OTHER)
Admission: RE | Admit: 2020-04-06 | Discharge: 2020-04-06 | Disposition: A | Discharge status: Home / Self Care | Payer: BC Managed Care – PPO | Source: Ambulatory Visit | Attending: Family Medicine | Admitting: Family Medicine

## 2020-04-06 ENCOUNTER — Emergency Department (HOSPITAL_BASED_OUTPATIENT_CLINIC_OR_DEPARTMENT_OTHER)
Admission: EM | Admit: 2020-04-06 | Discharge: 2020-04-06 | Disposition: A | Discharge status: Home / Self Care | Payer: BC Managed Care – PPO

## 2020-04-06 ENCOUNTER — Other Ambulatory Visit: Payer: Self-pay

## 2020-04-06 ENCOUNTER — Ambulatory Visit (HOSPITAL_COMMUNITY): Admission: RE | Admit: 2020-04-06 | Payer: BC Managed Care – PPO | Source: Ambulatory Visit

## 2020-04-06 DIAGNOSIS — R600 Localized edema: Secondary | ICD-10-CM | POA: Diagnosis not present

## 2020-04-06 DIAGNOSIS — M7989 Other specified soft tissue disorders: Secondary | ICD-10-CM | POA: Diagnosis not present

## 2020-04-06 DIAGNOSIS — M7121 Synovial cyst of popliteal space [Baker], right knee: Secondary | ICD-10-CM | POA: Diagnosis not present

## 2020-04-06 DIAGNOSIS — R2241 Localized swelling, mass and lump, right lower limb: Secondary | ICD-10-CM

## 2020-04-10 ENCOUNTER — Ambulatory Visit (HOSPITAL_COMMUNITY): Payer: BC Managed Care – PPO

## 2020-04-10 ENCOUNTER — Ambulatory Visit: Payer: BC Managed Care – PPO | Admitting: Sports Medicine

## 2020-04-12 ENCOUNTER — Ambulatory Visit (INDEPENDENT_AMBULATORY_CARE_PROVIDER_SITE_OTHER): Payer: BC Managed Care – PPO | Admitting: Sports Medicine

## 2020-04-12 ENCOUNTER — Other Ambulatory Visit: Payer: Self-pay

## 2020-04-12 ENCOUNTER — Encounter: Payer: Self-pay | Admitting: Sports Medicine

## 2020-04-12 VITALS — BP 114/78 | Ht 62.0 in | Wt 187.0 lb

## 2020-04-12 DIAGNOSIS — M25561 Pain in right knee: Secondary | ICD-10-CM

## 2020-04-12 DIAGNOSIS — M7121 Synovial cyst of popliteal space [Baker], right knee: Secondary | ICD-10-CM | POA: Diagnosis not present

## 2020-04-12 MED ORDER — METHYLPREDNISOLONE ACETATE 40 MG/ML IJ SUSP
40.0000 mg | Freq: Once | INTRAMUSCULAR | Status: AC
  Administered 2020-04-12: 40 mg via INTRA_ARTICULAR

## 2020-04-12 NOTE — Progress Notes (Signed)
PCP: Orland Mustard, MD  Subjective:   HPI: Patient is a 64 y.o. female here for evaluation of right knee pain.  Patient reports that 2 to 3 weeks ago, she developed pain in her right knee.  She states that she has not had any known history of any knee problems has not had pain in the past.  She started having pain in the posterior aspect of her knee that radiated around to the lateral aspect of her knee.  She did not have any sudden injuries prior to the onset of her symptoms but her discomfort was acute in onset.  In an effort to alleviate her discomfort, she tried to stretch her leg but felt a pop in the popliteal fossa.   She then developed swelling in the calf and distal thigh.  She presented to her PCP who ordered an ultrasound to rule out DVT.  This did not show any signs of DVT but did show a 6 x 1 x 5 cm Baker's cyst.   Today, patient reports that she continues to have swelling and pain  Review of Systems:  Per HPI.   PMFSH, medications and smoking status reviewed.      Objective:  Physical Exam:  Gen: awake, alert, NAD, comfortable in exam room Pulm: breathing unlabored  Knee  Inspection: Right distal thigh with small amount of edema present.  No ecchymosis, swelling, Active ROM: Mildly decreased 0-140d.  Strength: 5/5 strength to resisted flexion/extension without pain  Patella: Negative patellar grind. No patellar facet tenderness. No apprehension. No proximal or distal patellar tendon tenderness to palpation. No quad tendon tenderness to palpation.  Tibia: No tibial plateau, tibial tuberosity tenderness.  Joint line: No joint line tenderness.  Popliteal: Mild popliteal tenderness and mass palpable. No insertional biceps femoris, semimembranosis, semitendinosis tenderness.  Thessaly's: Negative bilaterally for pain, catching  Lachmans: Stable bilaterally with firm endpoint  Anterior/Posterior drawer: Stable bilaterally Varus/valgus stress at 0, 15d: Negative for pain,  laxity  US examination of the knee: Quadriceps tendon visualized and without abnormality. Suprapatellar pouch shows a small effusion laterally. Patellar tendon visualized and without any abnormalities. Medial meniscus visualized and without abnormalities. Lateral meniscus visualized and without any abnormalities. Posterior knee shows a large complex cyst with some hyperechogenicity within the fluid.  The cyst does communicate laterally.  Impression: -Large Baker's cyst -Mild knee effusion     Assessment & Plan:  1.  Acute right knee pain 2.  Baker's cyst  Patient with acute knee pain and Baker's cyst found during ultrasound for DVT.  Ultrasound today continues to show a complex Baker's cyst.  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  We discussed options including aspiration of the cyst, corticosteroid injection into the knee joint, and watchful waiting.  Given the fact that this is a complex cyst,  we will start with a corticosteroid injection into the knee.  Patient was also provided a compression sleeve and she will continue on over-the-counter Aleve.  We will schedule a tentative follow-up in 2 weeks and I will place an order for x-rays of her knee.  If she does not notice any improvement with today's injection, then she will proceed with those x-rays prior to her follow-up visit.  We will determine further work-up and treatment based on those x-ray results.  Procedure Note: After informed written consent timeout was performed, patient was seated on exam table. Right knee was prepped with  alcohol swab and utilizing anteromedial approach, patient's right knee was injected intraarticularly with 3:1 bupivicaine: depomedrol. Patient tolerated the procedure well without immediate complications.     Guy Sandifer, MD [facility] 04/12/2020 3:52 PM   Patient seen and evaluated with  the sports medicine fellow.  I agree with the above plan of care.  Cortisone injection administered as above.  Follow-up in 2 weeks if still symptomatic.

## 2020-04-12 NOTE — Patient Instructions (Addendum)
Thank you for coming in to see [place] today.  You have a large Baker's cyst, and this is likely due to knee arthritis.  Please see below to review our plan for today's visit:   1.   You received a knee injection today, hopefully this will help resolve your pain and strengthen the Baker's cyst.  You should expect some soreness tomorrow, and then the steroid will start working over the next 2 to 3 days. 2.   You may continue to take naproxen or ibuprofen for pain. 3.   Please use a compression sleeve during the day 4.  Please go to get x-rays of your knee for further evaluation. 5.  Please follow-up with [place] in 2 weeks to reevaluate.   Please call the clinic at [phone] if your symptoms worsen or you have any concerns. It was our pleasure to serve you.       Dr. Guy Sandifer Dr. Margaretha Sheffield since that visit, patient has continued to have significant pain over his posterior arm just proximal to the elbow.  He also has significant bruising in this area and distally.  He has some very mild pain in the proximal forearm as well.  Since the visit since his last visit [facility]

## 2020-04-25 ENCOUNTER — Ambulatory Visit
Admission: RE | Admit: 2020-04-25 | Discharge: 2020-04-25 | Disposition: A | Discharge status: Home / Self Care | Payer: BC Managed Care – PPO | Source: Ambulatory Visit | Attending: Sports Medicine | Admitting: Sports Medicine

## 2020-04-25 DIAGNOSIS — M25561 Pain in right knee: Secondary | ICD-10-CM | POA: Diagnosis not present

## 2020-04-26 ENCOUNTER — Ambulatory Visit: Payer: BC Managed Care – PPO | Admitting: Sports Medicine

## 2020-04-30 ENCOUNTER — Other Ambulatory Visit: Payer: Self-pay

## 2020-04-30 ENCOUNTER — Telehealth: Payer: Self-pay | Admitting: Sports Medicine

## 2020-04-30 DIAGNOSIS — M25561 Pain in right knee: Secondary | ICD-10-CM

## 2020-04-30 NOTE — Telephone Encounter (Signed)
  I spoke with the patient on the phone today after reviewing x-rays of her right knee.  X-ray shows a paucity of degenerative change.  No explanation for her Baker's cyst.  Her pain did improve for couple days after her most recent cortisone injection but she is now beginning to experience sharp pain in the anterior knee.  She also describes the inability to completely extend the right knee.  At this point in time, I have recommended an MRI of the right knee specifically to rule out a meniscal tear which may be responsible for her Baker's cyst.  Phone follow-up with those results when available and we will delineate more definitive treatment based on those results.

## 2020-05-01 ENCOUNTER — Ambulatory Visit: Payer: BC Managed Care – PPO | Admitting: Sports Medicine

## 2020-05-07 ENCOUNTER — Encounter: Payer: Self-pay | Admitting: Family Medicine

## 2020-05-19 ENCOUNTER — Ambulatory Visit
Admission: RE | Admit: 2020-05-19 | Discharge: 2020-05-19 | Disposition: A | Discharge status: Home / Self Care | Payer: BC Managed Care – PPO | Source: Ambulatory Visit | Attending: Sports Medicine | Admitting: Sports Medicine

## 2020-05-19 ENCOUNTER — Other Ambulatory Visit: Payer: Self-pay

## 2020-05-19 DIAGNOSIS — M25561 Pain in right knee: Secondary | ICD-10-CM

## 2020-05-22 ENCOUNTER — Other Ambulatory Visit: Payer: Self-pay

## 2020-05-22 ENCOUNTER — Encounter: Payer: Self-pay | Admitting: Family Medicine

## 2020-05-22 DIAGNOSIS — M7121 Synovial cyst of popliteal space [Baker], right knee: Secondary | ICD-10-CM

## 2020-05-23 ENCOUNTER — Other Ambulatory Visit: Payer: Self-pay | Admitting: Sports Medicine

## 2020-05-23 ENCOUNTER — Other Ambulatory Visit: Payer: Self-pay

## 2020-05-23 ENCOUNTER — Telehealth: Payer: Self-pay | Admitting: Sports Medicine

## 2020-05-23 DIAGNOSIS — M7121 Synovial cyst of popliteal space [Baker], right knee: Secondary | ICD-10-CM

## 2020-05-23 NOTE — Telephone Encounter (Signed)
°  I spoke with the patient on the phone yesterday after reviewing the MRI of her right knee.  MRI shows an oblique grade 3 tear of the posterior horn of the mid body of the lateral meniscus.  She also has a vertical tear in the anterior horn of the lateral meniscus.  Moderate knee joint effusion and moderate complex Baker's cyst.  Mild to moderate degenerative changes throughout the knee.  I discussed referral to orthopedics to discuss arthroscopy but she is not interested in surgery.  She would like to have the Baker's cyst aspirated.  Given its complexity, I think this would be best achieved with interventional radiology.  Therefore, I will refer her to [facility] imaging to have her Baker's cyst aspirated and injected.  If symptoms persist thereafter she may need to reconsider merits of referral to discuss arthroscopy.

## 2020-06-20 DIAGNOSIS — M7121 Synovial cyst of popliteal space [Baker], right knee: Secondary | ICD-10-CM | POA: Diagnosis not present

## 2020-07-08 ENCOUNTER — Encounter: Payer: Self-pay | Admitting: Family Medicine

## 2020-07-29 ENCOUNTER — Encounter: Payer: Self-pay | Admitting: Family Medicine

## 2020-07-30 ENCOUNTER — Encounter: Payer: Self-pay | Admitting: Family Medicine

## 2020-07-30 ENCOUNTER — Other Ambulatory Visit: Payer: BC Managed Care – PPO

## 2020-07-30 DIAGNOSIS — Z20822 Contact with and (suspected) exposure to covid-19: Secondary | ICD-10-CM | POA: Diagnosis not present

## 2020-07-30 NOTE — Telephone Encounter (Signed)
Please Advise

## 2020-07-31 LAB — SARS-COV-2, NAA 2 DAY TAT

## 2020-07-31 LAB — NOVEL CORONAVIRUS, NAA: SARS-CoV-2, NAA: NOT DETECTED

## 2020-08-08 DIAGNOSIS — M9906 Segmental and somatic dysfunction of lower extremity: Secondary | ICD-10-CM | POA: Diagnosis not present

## 2020-08-08 DIAGNOSIS — M5137 Other intervertebral disc degeneration, lumbosacral region: Secondary | ICD-10-CM | POA: Diagnosis not present

## 2020-08-08 DIAGNOSIS — M9903 Segmental and somatic dysfunction of lumbar region: Secondary | ICD-10-CM | POA: Diagnosis not present

## 2020-08-08 DIAGNOSIS — M25561 Pain in right knee: Secondary | ICD-10-CM | POA: Diagnosis not present

## 2020-08-09 DIAGNOSIS — F332 Major depressive disorder, recurrent severe without psychotic features: Secondary | ICD-10-CM | POA: Diagnosis not present

## 2020-08-13 DIAGNOSIS — M2142 Flat foot [pes planus] (acquired), left foot: Secondary | ICD-10-CM | POA: Diagnosis not present

## 2020-08-13 DIAGNOSIS — M2141 Flat foot [pes planus] (acquired), right foot: Secondary | ICD-10-CM | POA: Diagnosis not present

## 2020-08-13 DIAGNOSIS — M9906 Segmental and somatic dysfunction of lower extremity: Secondary | ICD-10-CM | POA: Diagnosis not present

## 2020-08-13 DIAGNOSIS — M9903 Segmental and somatic dysfunction of lumbar region: Secondary | ICD-10-CM | POA: Diagnosis not present

## 2020-08-14 DIAGNOSIS — M9906 Segmental and somatic dysfunction of lower extremity: Secondary | ICD-10-CM | POA: Diagnosis not present

## 2020-08-14 DIAGNOSIS — M5137 Other intervertebral disc degeneration, lumbosacral region: Secondary | ICD-10-CM | POA: Diagnosis not present

## 2020-08-14 DIAGNOSIS — M9903 Segmental and somatic dysfunction of lumbar region: Secondary | ICD-10-CM | POA: Diagnosis not present

## 2020-08-14 DIAGNOSIS — M25561 Pain in right knee: Secondary | ICD-10-CM | POA: Diagnosis not present

## 2020-08-15 DIAGNOSIS — M9906 Segmental and somatic dysfunction of lower extremity: Secondary | ICD-10-CM | POA: Diagnosis not present

## 2020-08-15 DIAGNOSIS — M5137 Other intervertebral disc degeneration, lumbosacral region: Secondary | ICD-10-CM | POA: Diagnosis not present

## 2020-08-15 DIAGNOSIS — M9903 Segmental and somatic dysfunction of lumbar region: Secondary | ICD-10-CM | POA: Diagnosis not present

## 2020-08-15 DIAGNOSIS — M25561 Pain in right knee: Secondary | ICD-10-CM | POA: Diagnosis not present

## 2020-08-16 DIAGNOSIS — F332 Major depressive disorder, recurrent severe without psychotic features: Secondary | ICD-10-CM | POA: Diagnosis not present

## 2020-08-22 DIAGNOSIS — F332 Major depressive disorder, recurrent severe without psychotic features: Secondary | ICD-10-CM | POA: Diagnosis not present

## 2020-08-23 DIAGNOSIS — M5137 Other intervertebral disc degeneration, lumbosacral region: Secondary | ICD-10-CM | POA: Diagnosis not present

## 2020-08-23 DIAGNOSIS — M9906 Segmental and somatic dysfunction of lower extremity: Secondary | ICD-10-CM | POA: Diagnosis not present

## 2020-08-23 DIAGNOSIS — M9903 Segmental and somatic dysfunction of lumbar region: Secondary | ICD-10-CM | POA: Diagnosis not present

## 2020-08-23 DIAGNOSIS — M25561 Pain in right knee: Secondary | ICD-10-CM | POA: Diagnosis not present

## 2020-08-27 DIAGNOSIS — M5137 Other intervertebral disc degeneration, lumbosacral region: Secondary | ICD-10-CM | POA: Diagnosis not present

## 2020-08-27 DIAGNOSIS — M9906 Segmental and somatic dysfunction of lower extremity: Secondary | ICD-10-CM | POA: Diagnosis not present

## 2020-08-27 DIAGNOSIS — M25561 Pain in right knee: Secondary | ICD-10-CM | POA: Diagnosis not present

## 2020-08-27 DIAGNOSIS — M9903 Segmental and somatic dysfunction of lumbar region: Secondary | ICD-10-CM | POA: Diagnosis not present

## 2020-08-30 DIAGNOSIS — M25561 Pain in right knee: Secondary | ICD-10-CM | POA: Diagnosis not present

## 2020-08-30 DIAGNOSIS — M9903 Segmental and somatic dysfunction of lumbar region: Secondary | ICD-10-CM | POA: Diagnosis not present

## 2020-08-30 DIAGNOSIS — M5137 Other intervertebral disc degeneration, lumbosacral region: Secondary | ICD-10-CM | POA: Diagnosis not present

## 2020-08-30 DIAGNOSIS — M9906 Segmental and somatic dysfunction of lower extremity: Secondary | ICD-10-CM | POA: Diagnosis not present

## 2020-09-03 ENCOUNTER — Telehealth: Payer: Self-pay

## 2020-09-03 NOTE — Telephone Encounter (Signed)
err

## 2020-09-04 DIAGNOSIS — M25561 Pain in right knee: Secondary | ICD-10-CM | POA: Diagnosis not present

## 2020-09-04 DIAGNOSIS — M9906 Segmental and somatic dysfunction of lower extremity: Secondary | ICD-10-CM | POA: Diagnosis not present

## 2020-09-04 DIAGNOSIS — M5137 Other intervertebral disc degeneration, lumbosacral region: Secondary | ICD-10-CM | POA: Diagnosis not present

## 2020-09-04 DIAGNOSIS — M9903 Segmental and somatic dysfunction of lumbar region: Secondary | ICD-10-CM | POA: Diagnosis not present

## 2020-09-06 DIAGNOSIS — M9903 Segmental and somatic dysfunction of lumbar region: Secondary | ICD-10-CM | POA: Diagnosis not present

## 2020-09-06 DIAGNOSIS — M9906 Segmental and somatic dysfunction of lower extremity: Secondary | ICD-10-CM | POA: Diagnosis not present

## 2020-09-06 DIAGNOSIS — F332 Major depressive disorder, recurrent severe without psychotic features: Secondary | ICD-10-CM | POA: Diagnosis not present

## 2020-09-06 DIAGNOSIS — M5137 Other intervertebral disc degeneration, lumbosacral region: Secondary | ICD-10-CM | POA: Diagnosis not present

## 2020-09-06 DIAGNOSIS — M25561 Pain in right knee: Secondary | ICD-10-CM | POA: Diagnosis not present

## 2020-09-07 ENCOUNTER — Other Ambulatory Visit: Payer: Self-pay | Admitting: Family Medicine

## 2020-09-07 ENCOUNTER — Other Ambulatory Visit: Payer: Self-pay

## 2020-09-07 ENCOUNTER — Ambulatory Visit (INDEPENDENT_AMBULATORY_CARE_PROVIDER_SITE_OTHER): Payer: BC Managed Care – PPO

## 2020-09-07 ENCOUNTER — Encounter: Payer: Self-pay | Admitting: Family Medicine

## 2020-09-07 ENCOUNTER — Ambulatory Visit: Payer: BC Managed Care – PPO | Admitting: Family Medicine

## 2020-09-07 VITALS — BP 124/82 | HR 63 | Temp 98.0°F | Ht 62.0 in | Wt 205.0 lb

## 2020-09-07 DIAGNOSIS — R002 Palpitations: Secondary | ICD-10-CM | POA: Diagnosis not present

## 2020-09-07 DIAGNOSIS — Z8249 Family history of ischemic heart disease and other diseases of the circulatory system: Secondary | ICD-10-CM | POA: Diagnosis not present

## 2020-09-07 DIAGNOSIS — Z8041 Family history of malignant neoplasm of ovary: Secondary | ICD-10-CM

## 2020-09-07 DIAGNOSIS — Z1231 Encounter for screening mammogram for malignant neoplasm of breast: Secondary | ICD-10-CM

## 2020-09-07 DIAGNOSIS — K219 Gastro-esophageal reflux disease without esophagitis: Secondary | ICD-10-CM

## 2020-09-07 MED ORDER — MUPIROCIN CALCIUM 2 % EX CREA: 1.0000 "application " | TOPICAL_CREAM | Freq: Two times a day (BID) | CUTANEOUS | 0 refills | Status: DC

## 2020-09-07 NOTE — Progress Notes (Signed)
Patient: Carla Washington MRN: [id number] DOB: [birth year] PCP: Orma Flaming, MD     Subjective:  Chief Complaint  Patient presents with  . Referral    Pt would like to have a stress test, she says she has Heart Disease in her family. She is also requesting to be seen in GI due to H-Pylori years go. And her sister recently passed from cancer. She also would like to establish an OBGYN, she hasn't had a mammo or Pap smear in years as well.    HPI: The patient is a 65 y.o. female who presents today for referrals to Cardiology, GI, and OBGYN.   She has had h.pylori in the past. She had indigestion in the past when she was diagnosed with h.pylori. she had an EGD that diagnosed it at that time. She is now having increased reflux and is not taking anything for this. It could be that she is eating too much. She is going to start a detox soon. She does not eat any spicy, greasy or fatty foods. She does not take a lot of NSAIDs. She does drink caffeine, about 2 cups/day. She has not traveled anywhere recently, but from [place]. She is stressed with recent loss of her sister. She has no pain in her stomach. No nausea or vomiting.    She also would like referral for GYN. Sister just had ovarian cancer and likely needs BRCA testing. Would also like for her pap smear. Information given for mammogram.   She also has family history of MI in her father at age 43 years and her brother had triple bypass at 25 years of age. She also has hx of palpitations that she has had since menopause. She will notice a skipped beat or feel a flutter. She will cough and it goes away. No shortness of breath with it. She has no swelling in her legs or coughing.    She has had her covid vaccine with J&J in October and got a booster in January.   Review of Systems  Constitutional: Negative for chills, fatigue and fever.  HENT: Negative for dental problem, ear pain, hearing loss and trouble swallowing.   Eyes: Negative for  visual disturbance.  Respiratory: Negative for cough, chest tightness and shortness of breath.   Cardiovascular: Negative for chest pain, palpitations and leg swelling.  Gastrointestinal: Negative for abdominal pain, blood in stool, diarrhea and nausea.  Endocrine: Negative for cold intolerance, polydipsia, polyphagia and polyuria.  Genitourinary: Negative for dysuria and hematuria.  Musculoskeletal: Negative for arthralgias.  Skin: Negative for rash.  Neurological: Negative for dizziness and headaches.  Psychiatric/Behavioral: Negative for dysphoric mood and sleep disturbance. The patient is not nervous/anxious.     Allergies Patient is allergic to penicillins.  Past Medical History Patient  has a past medical history of Abnormal C-reactive protein (10/14/2015), B12 deficiency (06/27/2018), Carotid bruit (11/01/2016), HLD (hyperlipidemia) (06/27/2018), Obesity (11/01/2016), Osteopenia (08/04/2009), Prediabetes (53/66/4403), Systolic murmur (47/42/5956), and Vitamin D deficiency (06/27/2018).  Surgical History Patient  has a past surgical history that includes Cesarean section; Tonsilectomy/adenoidectomy with myringotomy; Colonoscopy (07/04/2013); and removal of ovarian cyst (08/04/1982).  Family History Pateint's family history includes Alzheimer's disease in her mother; Heart disease in her father.  Social History Patient  reports that she quit smoking about 27 years ago. She smoked 0.01 packs per day. She has never used smokeless tobacco. She reports that she does not drink alcohol and does not use drugs.    Objective: Vitals:   09/07/20 1402  BP: 124/82  Pulse: 63  Temp: 98 F (36.7 C)  SpO2: 99%  Weight: 205 lb (93 kg)  Height: 5' 2"  (1.575 m)    Body mass index is 37.49 kg/m.  Physical Exam Vitals reviewed.  Constitutional:      Appearance: Normal appearance. She is well-developed and well-nourished. She is obese.  HENT:     Head: Normocephalic and atraumatic.      Right Ear: Tympanic membrane, ear canal and external ear normal.     Left Ear: Tympanic membrane, ear canal and external ear normal.     Nose: Nose normal.     Mouth/Throat:     Mouth: Oropharynx is clear and moist. Mucous membranes are moist.  Eyes:     Extraocular Movements: EOM normal.     Conjunctiva/sclera: Conjunctivae normal.     Pupils: Pupils are equal, round, and reactive to light.  Neck:     Thyroid: No thyromegaly.  Cardiovascular:     Rate and Rhythm: Normal rate and regular rhythm.     Pulses: Intact distal pulses.     Heart sounds: Normal heart sounds. No murmur heard.   Pulmonary:     Effort: Pulmonary effort is normal.     Breath sounds: Normal breath sounds.  Abdominal:     General: Bowel sounds are normal. There is no distension.     Palpations: Abdomen is soft.     Tenderness: There is no abdominal tenderness.  Musculoskeletal:     Cervical back: Normal range of motion and neck supple.  Lymphadenopathy:     Cervical: No cervical adenopathy.  Skin:    General: Skin is warm and dry.     Findings: No rash.  Neurological:     General: No focal deficit present.     Mental Status: She is alert and oriented to person, place, and time.     Cranial Nerves: No cranial nerve deficit.     Coordination: Coordination normal.     Deep Tendon Reflexes: Reflexes normal.  Psychiatric:        Mood and Affect: Mood and affect and mood normal.        Behavior: Behavior normal.        Assessment/plan: 1. Gastroesophageal reflux disease, unspecified whether esophagitis present Does not really sound too symptomatic for gastritis. Will check hpylori today. Discussed no need for referral to GI at this time. She doesn't want any medication either as she can control with diet.  - H. pylori breath test  2. Palpitations Checking long term monitor and labs today. Will hold off on cardiology referral. Did offer, but she will get monitor done first. Precautions given.  - LONG  TERM MONITOR (3-14 DAYS); Future - TSH; Future - Comprehensive metabolic panel; Future - CBC with Differential/Platelet; Future - CBC with Differential/Platelet - Comprehensive metabolic panel - TSH  3. Family history of early CAD Checking CT score.  - CT CARDIAC SCORING (SELF PAY ONLY); Future  4. Family history of ovarian cancer Needs BRCA testing, but she will discuss with gyn at her appointment.     This visit occurred during the SARS-CoV-2 public health emergency.  Safety protocols were in place, including screening questions prior to the visit, additional usage of staff PPE, and extensive cleaning of exam room while observing appropriate contact time as indicated for disinfecting solutions.     Return if symptoms worsen or fail to improve.   Orma Flaming, MD [place]   09/07/2020

## 2020-09-07 NOTE — Patient Instructions (Addendum)
1) gynecologists  Dr. Gertie Exon [facility] [place].  Dr. Leda Quail Dr. Barton Fanny ([facility])    Get your mammogram: handout given.    1) ordered cardiac monitor and cardiac CT score (will llook for buildup in your arteries) they will call you with both of these    Checking for h.pylori today and other labs for palpitations.   So good to see you! Dr. Artis Flock

## 2020-09-08 LAB — CBC WITH DIFFERENTIAL/PLATELET
Absolute Monocytes: 554 cells/uL (ref 200–950)
Basophils Absolute: 50 cells/uL (ref 0–200)
Basophils Relative: 0.8 %
Eosinophils Absolute: 252 cells/uL (ref 15–500)
Eosinophils Relative: 4 %
HCT: 41.9 % (ref 35.0–45.0)
Hemoglobin: 13.8 g/dL (ref 11.7–15.5)
Lymphs Abs: 2558 cells/uL (ref 850–3900)
MCH: 25.9 pg — ABNORMAL LOW (ref 27.0–33.0)
MCHC: 32.9 g/dL (ref 32.0–36.0)
MCV: 78.6 fL — ABNORMAL LOW (ref 80.0–100.0)
MPV: 10.6 fL (ref 7.5–12.5)
Monocytes Relative: 8.8 %
Neutro Abs: 2885 cells/uL (ref 1500–7800)
Neutrophils Relative %: 45.8 %
Platelets: 282 10*3/uL (ref 140–400)
RBC: 5.33 10*6/uL — ABNORMAL HIGH (ref 3.80–5.10)
RDW: 13.1 % (ref 11.0–15.0)
Total Lymphocyte: 40.6 %
WBC: 6.3 10*3/uL (ref 3.8–10.8)

## 2020-09-08 LAB — COMPREHENSIVE METABOLIC PANEL
AG Ratio: 1.5 (calc) (ref 1.0–2.5)
ALT: 15 U/L (ref 6–29)
AST: 20 U/L (ref 10–35)
Albumin: 4.3 g/dL (ref 3.6–5.1)
Alkaline phosphatase (APISO): 68 U/L (ref 37–153)
BUN: 18 mg/dL (ref 7–25)
CO2: 24 mmol/L (ref 20–32)
Calcium: 9.8 mg/dL (ref 8.6–10.4)
Chloride: 102 mmol/L (ref 98–110)
Creat: 0.76 mg/dL (ref 0.50–0.99)
Globulin: 2.9 g/dL (calc) (ref 1.9–3.7)
Glucose, Bld: 99 mg/dL (ref 65–99)
Potassium: 4.7 mmol/L (ref 3.5–5.3)
Sodium: 142 mmol/L (ref 135–146)
Total Bilirubin: 0.5 mg/dL (ref 0.2–1.2)
Total Protein: 7.2 g/dL (ref 6.1–8.1)

## 2020-09-08 LAB — TSH: TSH: 2.19 mIU/L (ref 0.40–4.50)

## 2020-09-10 ENCOUNTER — Encounter: Payer: Self-pay | Admitting: Family Medicine

## 2020-09-10 LAB — H. PYLORI BREATH TEST: H. pylori Breath Test: NOT DETECTED

## 2020-09-11 DIAGNOSIS — M9903 Segmental and somatic dysfunction of lumbar region: Secondary | ICD-10-CM | POA: Diagnosis not present

## 2020-09-11 DIAGNOSIS — M25561 Pain in right knee: Secondary | ICD-10-CM | POA: Diagnosis not present

## 2020-09-11 DIAGNOSIS — M5137 Other intervertebral disc degeneration, lumbosacral region: Secondary | ICD-10-CM | POA: Diagnosis not present

## 2020-09-11 DIAGNOSIS — M9906 Segmental and somatic dysfunction of lower extremity: Secondary | ICD-10-CM | POA: Diagnosis not present

## 2020-09-12 DIAGNOSIS — R002 Palpitations: Secondary | ICD-10-CM | POA: Diagnosis not present

## 2020-09-13 ENCOUNTER — Encounter: Payer: Self-pay | Admitting: Family Medicine

## 2020-09-13 DIAGNOSIS — D561 Beta thalassemia: Secondary | ICD-10-CM | POA: Insufficient documentation

## 2020-09-17 DIAGNOSIS — M9906 Segmental and somatic dysfunction of lower extremity: Secondary | ICD-10-CM | POA: Diagnosis not present

## 2020-09-17 DIAGNOSIS — M25561 Pain in right knee: Secondary | ICD-10-CM | POA: Diagnosis not present

## 2020-09-17 DIAGNOSIS — M5137 Other intervertebral disc degeneration, lumbosacral region: Secondary | ICD-10-CM | POA: Diagnosis not present

## 2020-09-17 DIAGNOSIS — M9903 Segmental and somatic dysfunction of lumbar region: Secondary | ICD-10-CM | POA: Diagnosis not present

## 2020-09-19 DIAGNOSIS — M9906 Segmental and somatic dysfunction of lower extremity: Secondary | ICD-10-CM | POA: Diagnosis not present

## 2020-09-19 DIAGNOSIS — M9903 Segmental and somatic dysfunction of lumbar region: Secondary | ICD-10-CM | POA: Diagnosis not present

## 2020-09-19 DIAGNOSIS — M25561 Pain in right knee: Secondary | ICD-10-CM | POA: Diagnosis not present

## 2020-09-19 DIAGNOSIS — M5137 Other intervertebral disc degeneration, lumbosacral region: Secondary | ICD-10-CM | POA: Diagnosis not present

## 2020-09-20 DIAGNOSIS — F332 Major depressive disorder, recurrent severe without psychotic features: Secondary | ICD-10-CM | POA: Diagnosis not present

## 2020-09-24 ENCOUNTER — Other Ambulatory Visit: Payer: BC Managed Care – PPO

## 2020-09-24 ENCOUNTER — Inpatient Hospital Stay: Admission: RE | Admit: 2020-09-24 | Payer: BC Managed Care – PPO | Source: Ambulatory Visit

## 2020-09-25 DIAGNOSIS — M5137 Other intervertebral disc degeneration, lumbosacral region: Secondary | ICD-10-CM | POA: Diagnosis not present

## 2020-09-25 DIAGNOSIS — M9903 Segmental and somatic dysfunction of lumbar region: Secondary | ICD-10-CM | POA: Diagnosis not present

## 2020-09-25 DIAGNOSIS — M9906 Segmental and somatic dysfunction of lower extremity: Secondary | ICD-10-CM | POA: Diagnosis not present

## 2020-09-25 DIAGNOSIS — M25561 Pain in right knee: Secondary | ICD-10-CM | POA: Diagnosis not present

## 2020-09-27 DIAGNOSIS — M9906 Segmental and somatic dysfunction of lower extremity: Secondary | ICD-10-CM | POA: Diagnosis not present

## 2020-09-27 DIAGNOSIS — M5137 Other intervertebral disc degeneration, lumbosacral region: Secondary | ICD-10-CM | POA: Diagnosis not present

## 2020-09-27 DIAGNOSIS — M9903 Segmental and somatic dysfunction of lumbar region: Secondary | ICD-10-CM | POA: Diagnosis not present

## 2020-09-27 DIAGNOSIS — M25561 Pain in right knee: Secondary | ICD-10-CM | POA: Diagnosis not present

## 2020-10-01 ENCOUNTER — Ambulatory Visit (INDEPENDENT_AMBULATORY_CARE_PROVIDER_SITE_OTHER)
Admission: RE | Admit: 2020-10-01 | Discharge: 2020-10-01 | Disposition: A | Discharge status: Home / Self Care | Payer: Self-pay | Source: Ambulatory Visit | Attending: Family Medicine | Admitting: Family Medicine

## 2020-10-01 ENCOUNTER — Other Ambulatory Visit: Payer: Self-pay

## 2020-10-01 DIAGNOSIS — Z8249 Family history of ischemic heart disease and other diseases of the circulatory system: Secondary | ICD-10-CM

## 2020-10-02 DIAGNOSIS — M25561 Pain in right knee: Secondary | ICD-10-CM | POA: Diagnosis not present

## 2020-10-02 DIAGNOSIS — M5137 Other intervertebral disc degeneration, lumbosacral region: Secondary | ICD-10-CM | POA: Diagnosis not present

## 2020-10-02 DIAGNOSIS — M9903 Segmental and somatic dysfunction of lumbar region: Secondary | ICD-10-CM | POA: Diagnosis not present

## 2020-10-02 DIAGNOSIS — M9906 Segmental and somatic dysfunction of lower extremity: Secondary | ICD-10-CM | POA: Diagnosis not present

## 2020-10-10 DIAGNOSIS — M9906 Segmental and somatic dysfunction of lower extremity: Secondary | ICD-10-CM | POA: Diagnosis not present

## 2020-10-10 DIAGNOSIS — M5137 Other intervertebral disc degeneration, lumbosacral region: Secondary | ICD-10-CM | POA: Diagnosis not present

## 2020-10-10 DIAGNOSIS — M25561 Pain in right knee: Secondary | ICD-10-CM | POA: Diagnosis not present

## 2020-10-10 DIAGNOSIS — M9903 Segmental and somatic dysfunction of lumbar region: Secondary | ICD-10-CM | POA: Diagnosis not present

## 2020-10-16 ENCOUNTER — Encounter: Payer: BC Managed Care – PPO | Admitting: Obstetrics and Gynecology

## 2020-10-19 ENCOUNTER — Encounter: Payer: Self-pay | Admitting: Family Medicine

## 2020-10-19 DIAGNOSIS — Z800 Family history of malignant neoplasm of digestive organs: Secondary | ICD-10-CM

## 2020-10-22 ENCOUNTER — Encounter: Payer: Self-pay | Admitting: Family Medicine

## 2020-10-22 DIAGNOSIS — Z800 Family history of malignant neoplasm of digestive organs: Secondary | ICD-10-CM | POA: Insufficient documentation

## 2020-10-24 ENCOUNTER — Ambulatory Visit: Payer: BC Managed Care – PPO

## 2020-10-27 ENCOUNTER — Ambulatory Visit
Admission: RE | Admit: 2020-10-27 | Discharge: 2020-10-27 | Disposition: A | Discharge status: Home / Self Care | Payer: BC Managed Care – PPO | Source: Ambulatory Visit | Attending: Family Medicine | Admitting: Family Medicine

## 2020-10-27 ENCOUNTER — Other Ambulatory Visit: Payer: Self-pay

## 2020-10-27 DIAGNOSIS — Z1231 Encounter for screening mammogram for malignant neoplasm of breast: Secondary | ICD-10-CM | POA: Diagnosis not present

## 2020-11-06 ENCOUNTER — Other Ambulatory Visit: Payer: Self-pay | Admitting: Family Medicine

## 2020-11-06 DIAGNOSIS — R928 Other abnormal and inconclusive findings on diagnostic imaging of breast: Secondary | ICD-10-CM

## 2020-11-07 ENCOUNTER — Other Ambulatory Visit (HOSPITAL_COMMUNITY)
Admission: RE | Admit: 2020-11-07 | Discharge: 2020-11-07 | Disposition: A | Discharge status: Home / Self Care | Payer: BC Managed Care – PPO | Source: Ambulatory Visit | Attending: Obstetrics and Gynecology | Admitting: Obstetrics and Gynecology

## 2020-11-07 ENCOUNTER — Other Ambulatory Visit: Payer: Self-pay

## 2020-11-07 ENCOUNTER — Encounter: Payer: Self-pay | Admitting: Obstetrics and Gynecology

## 2020-11-07 ENCOUNTER — Ambulatory Visit (INDEPENDENT_AMBULATORY_CARE_PROVIDER_SITE_OTHER): Payer: BC Managed Care – PPO | Admitting: Obstetrics and Gynecology

## 2020-11-07 VITALS — BP 132/74 | HR 77 | Ht 62.0 in | Wt 197.0 lb

## 2020-11-07 DIAGNOSIS — Z124 Encounter for screening for malignant neoplasm of cervix: Secondary | ICD-10-CM

## 2020-11-07 DIAGNOSIS — Z01419 Encounter for gynecological examination (general) (routine) without abnormal findings: Secondary | ICD-10-CM | POA: Diagnosis not present

## 2020-11-07 DIAGNOSIS — E2839 Other primary ovarian failure: Secondary | ICD-10-CM

## 2020-11-07 DIAGNOSIS — Z8739 Personal history of other diseases of the musculoskeletal system and connective tissue: Secondary | ICD-10-CM

## 2020-11-07 NOTE — Progress Notes (Signed)
65 y.o. [id number] Divorced White or Caucasian Unavailable female here for annual exam.   PMP, no bleeding. Not sexually active.   She states that her sister passed away in 20-Aug-2022 from what started ovarian cancer. Sister was diagnosed at 25, died 3 month later.   No bowel or bladder c/o   No LMP recorded. Patient is postmenopausal.          Sexually active: No.  The current method of family planning is post menopausal status.    Exercising: Yes.    walking Yoga Smoker:  no  Health Maintenance: Pap:  Maybe 5 years ago  History of abnormal Pap:  no MMG:  11/05/20 Incomplete. Has follow up on 11/29/20 BMD:  6-7 years ago Mild osteopenia.  Colonoscopy: scheduled for this year. Last one was normal  TDaP:  Unsure, declines  Gardasil: none    reports that she quit smoking about 27 years ago. She smoked 0.01 packs per day. She has never used smokeless tobacco. She reports that she does not drink alcohol and does not use drugs. She is a Multimedia programmer. Moved here from [place] to be closer to her Daughter. 3 grandchildren, all here. Sons are in [place] and [place].   Past Medical History:  Diagnosis Date  . Abnormal C-reactive protein 10/14/2015  . B12 deficiency 06/27/2018  . Carotid bruit 11/01/2016  . HLD (hyperlipidemia) 06/27/2018  . Obesity 11/01/2016  . Osteopenia 08/04/2009   DEXA 08/04/2009  . Prediabetes 10/14/2015  . Systolic murmur 11/01/2016  . Vitamin D deficiency 06/27/2018  H/O prediabetes, last HgbA1C was normal.   Past Surgical History:  Procedure Laterality Date  . CESAREAN SECTION     x 3  . COLONOSCOPY  07/04/2013  . removal of ovarian cyst  08/04/1982  . TONSILECTOMY/ADENOIDECTOMY WITH MYRINGOTOMY      Current Outpatient Medications  Medication Sig Dispense Refill  . ascorbic acid (VITAMIN C) 100 MG tablet Take by mouth.    . EPINEPHrine 0.3 mg/0.3 mL IJ SOAJ injection Inject 0.3 mLs (0.3 mg total) into the muscle as needed for anaphylaxis. 1 each 0  .  MAGNESIUM-ZINC PO Take by mouth.    . Melatonin 10 MG TABS Take by mouth.    . Turmeric 500 MG CAPS Take by mouth.    Marland Kitchen VITAMIN D PO Take 3,000 Int'l Units/1.37m2 by mouth.    Marland Kitchen VITAMIN K PO Take by mouth.     No current facility-administered medications for this visit.    Family History  Problem Relation Age of Onset  . Alzheimer's disease Mother   . Heart disease Father   . Ovarian cancer Sister 74    Review of Systems  All other systems reviewed and are negative.   Exam:   BP 132/74   Pulse 77   Ht 5\' 2"  (1.575 m)   Wt 197 lb (89.4 kg)   SpO2 98%   BMI 36.03 kg/m   Weight change: @WEIGHTCHANGE @ Height:   Height: 5\' 2"  (157.5 cm)  Ht Readings from Last 3 Encounters:  11/07/20 5\' 2"  (1.575 m)  09/07/20 5\' 2"  (1.575 m)  04/12/20 5\' 2"  (1.575 m)    General appearance: alert, cooperative and appears stated age Head: Normocephalic, without obvious abnormality, atraumatic Neck: no adenopathy, supple, symmetrical, trachea midline and thyroid normal to inspection and palpation Lungs: clear to auscultation bilaterally Cardiovascular: regular rate and rhythm Breasts: normal appearance, no masses or tenderness Abdomen: soft, non-tender; non distended,  no masses,  no organomegaly Extremities:  extremities normal, atraumatic, no cyanosis or edema Skin: Skin color, texture, turgor normal. No rashes or lesions Lymph nodes: Cervical, supraclavicular, and axillary nodes normal. No abnormal inguinal nodes palpated Neurologic: Grossly normal   Pelvic: External genitalia:  no lesions              Urethra:  normal appearing urethra with no masses, tenderness or lesions              Bartholins and Skenes: normal                 Vagina: normal appearing vagina with normal color and discharge, no lesions              Cervix: no lesions               Bimanual Exam:  Uterus:  no masses or tenderness              Adnexa: no mass, fullness, tenderness               Rectovaginal:  Confirms               Anus:  normal sphincter tone, no lesions  Carolynn Serve chaperoned for the exam.   1. Well woman exam Discussed breast self exam Discussed calcium and vit D intake Labs with primary Colonoscopy this year   2. History of osteopenia - DG Bone Density; Future  3. Screening for cervical cancer - Cytology - PAP  4. Hypoestrogenism - DG Bone Density; Future

## 2020-11-07 NOTE — Patient Instructions (Signed)

## 2020-11-09 LAB — CYTOLOGY - PAP
Comment: NEGATIVE
Diagnosis: NEGATIVE
High risk HPV: NEGATIVE

## 2020-11-29 ENCOUNTER — Ambulatory Visit
Admission: RE | Admit: 2020-11-29 | Discharge: 2020-11-29 | Disposition: A | Discharge status: Home / Self Care | Payer: BC Managed Care – PPO | Source: Ambulatory Visit | Attending: Family Medicine | Admitting: Family Medicine

## 2020-11-29 ENCOUNTER — Other Ambulatory Visit: Payer: Self-pay

## 2020-11-29 DIAGNOSIS — N6001 Solitary cyst of right breast: Secondary | ICD-10-CM | POA: Diagnosis not present

## 2020-11-29 DIAGNOSIS — R928 Other abnormal and inconclusive findings on diagnostic imaging of breast: Secondary | ICD-10-CM | POA: Diagnosis not present

## 2020-12-21 ENCOUNTER — Other Ambulatory Visit: Payer: Self-pay

## 2020-12-21 ENCOUNTER — Ambulatory Visit
Admission: RE | Admit: 2020-12-21 | Discharge: 2020-12-21 | Disposition: A | Discharge status: Home / Self Care | Payer: BC Managed Care – PPO | Source: Ambulatory Visit | Attending: Obstetrics and Gynecology | Admitting: Obstetrics and Gynecology

## 2020-12-21 DIAGNOSIS — M85851 Other specified disorders of bone density and structure, right thigh: Secondary | ICD-10-CM | POA: Diagnosis not present

## 2020-12-21 DIAGNOSIS — E2839 Other primary ovarian failure: Secondary | ICD-10-CM

## 2020-12-21 DIAGNOSIS — Z8739 Personal history of other diseases of the musculoskeletal system and connective tissue: Secondary | ICD-10-CM

## 2020-12-21 DIAGNOSIS — Z780 Asymptomatic menopausal state: Secondary | ICD-10-CM | POA: Diagnosis not present

## 2020-12-27 ENCOUNTER — Ambulatory Visit (AMBULATORY_SURGERY_CENTER): Payer: Self-pay

## 2020-12-27 ENCOUNTER — Other Ambulatory Visit: Payer: Self-pay

## 2020-12-27 VITALS — Ht 62.0 in | Wt 201.8 lb

## 2020-12-27 DIAGNOSIS — Z1211 Encounter for screening for malignant neoplasm of colon: Secondary | ICD-10-CM

## 2020-12-27 MED ORDER — PEG-KCL-NACL-NASULF-NA ASC-C 100 G PO SOLR: 1.0000 | Freq: Once | ORAL | 0 refills | Status: AC

## 2020-12-27 NOTE — Progress Notes (Signed)
No egg or soy allergy known to patient  No issues with past sedation with any surgeries or procedures Patient denies ever being told they had issues or difficulty with intubation  No FH of Malignant Hyperthermia No diet pills per patient No home 02 use per patient  No blood thinners per patient  Pt denies issues with constipation  No A fib or A flutter  EMMI video via MyChart  COVID 19 guidelines implemented in PV today with Pt and RN   NO PA's for preps discussed with pt in PV today  Discussed with pt there will be an out-of-pocket cost for prep and that varies from $0 to 70 dollars   Due to the COVID-19 pandemic we are asking patients to follow certain guidelines.  Pt aware of COVID protocols and [facility] guidelines   

## 2021-01-11 ENCOUNTER — Encounter: Payer: BC Managed Care – PPO | Admitting: Gastroenterology

## 2021-03-12 DIAGNOSIS — M62838 Other muscle spasm: Secondary | ICD-10-CM | POA: Diagnosis not present

## 2021-03-12 DIAGNOSIS — M9903 Segmental and somatic dysfunction of lumbar region: Secondary | ICD-10-CM | POA: Diagnosis not present

## 2021-03-12 DIAGNOSIS — M9902 Segmental and somatic dysfunction of thoracic region: Secondary | ICD-10-CM | POA: Diagnosis not present

## 2021-03-12 DIAGNOSIS — M5442 Lumbago with sciatica, left side: Secondary | ICD-10-CM | POA: Diagnosis not present

## 2021-03-12 DIAGNOSIS — M9905 Segmental and somatic dysfunction of pelvic region: Secondary | ICD-10-CM | POA: Diagnosis not present

## 2021-03-13 DIAGNOSIS — L820 Inflamed seborrheic keratosis: Secondary | ICD-10-CM | POA: Diagnosis not present

## 2021-03-14 DIAGNOSIS — M5442 Lumbago with sciatica, left side: Secondary | ICD-10-CM | POA: Diagnosis not present

## 2021-03-14 DIAGNOSIS — M62838 Other muscle spasm: Secondary | ICD-10-CM | POA: Diagnosis not present

## 2021-03-14 DIAGNOSIS — M9905 Segmental and somatic dysfunction of pelvic region: Secondary | ICD-10-CM | POA: Diagnosis not present

## 2021-03-14 DIAGNOSIS — M9902 Segmental and somatic dysfunction of thoracic region: Secondary | ICD-10-CM | POA: Diagnosis not present

## 2021-03-14 DIAGNOSIS — M9903 Segmental and somatic dysfunction of lumbar region: Secondary | ICD-10-CM | POA: Diagnosis not present

## 2021-03-19 DIAGNOSIS — M9903 Segmental and somatic dysfunction of lumbar region: Secondary | ICD-10-CM | POA: Diagnosis not present

## 2021-03-19 DIAGNOSIS — M5442 Lumbago with sciatica, left side: Secondary | ICD-10-CM | POA: Diagnosis not present

## 2021-03-19 DIAGNOSIS — M9905 Segmental and somatic dysfunction of pelvic region: Secondary | ICD-10-CM | POA: Diagnosis not present

## 2021-03-19 DIAGNOSIS — M9902 Segmental and somatic dysfunction of thoracic region: Secondary | ICD-10-CM | POA: Diagnosis not present

## 2021-03-19 DIAGNOSIS — M62838 Other muscle spasm: Secondary | ICD-10-CM | POA: Diagnosis not present

## 2021-03-21 DIAGNOSIS — M9902 Segmental and somatic dysfunction of thoracic region: Secondary | ICD-10-CM | POA: Diagnosis not present

## 2021-03-21 DIAGNOSIS — M9903 Segmental and somatic dysfunction of lumbar region: Secondary | ICD-10-CM | POA: Diagnosis not present

## 2021-03-21 DIAGNOSIS — M9905 Segmental and somatic dysfunction of pelvic region: Secondary | ICD-10-CM | POA: Diagnosis not present

## 2021-03-21 DIAGNOSIS — M5442 Lumbago with sciatica, left side: Secondary | ICD-10-CM | POA: Diagnosis not present

## 2021-03-21 DIAGNOSIS — M62838 Other muscle spasm: Secondary | ICD-10-CM | POA: Diagnosis not present

## 2021-04-02 DIAGNOSIS — M62838 Other muscle spasm: Secondary | ICD-10-CM | POA: Diagnosis not present

## 2021-04-02 DIAGNOSIS — M9902 Segmental and somatic dysfunction of thoracic region: Secondary | ICD-10-CM | POA: Diagnosis not present

## 2021-04-02 DIAGNOSIS — M9905 Segmental and somatic dysfunction of pelvic region: Secondary | ICD-10-CM | POA: Diagnosis not present

## 2021-04-02 DIAGNOSIS — M9903 Segmental and somatic dysfunction of lumbar region: Secondary | ICD-10-CM | POA: Diagnosis not present

## 2021-04-02 DIAGNOSIS — M5442 Lumbago with sciatica, left side: Secondary | ICD-10-CM | POA: Diagnosis not present

## 2021-06-03 ENCOUNTER — Encounter: Payer: Self-pay | Admitting: Family Medicine

## 2021-07-23 ENCOUNTER — Encounter: Payer: Self-pay | Admitting: Family

## 2021-07-23 ENCOUNTER — Telehealth (INDEPENDENT_AMBULATORY_CARE_PROVIDER_SITE_OTHER): Payer: BC Managed Care – PPO | Admitting: Family

## 2021-07-23 VITALS — Ht 62.0 in | Wt 190.0 lb

## 2021-07-23 DIAGNOSIS — U071 COVID-19: Secondary | ICD-10-CM | POA: Diagnosis not present

## 2021-07-23 MED ORDER — MOLNUPIRAVIR EUA 200MG CAPSULE: 4.0000 | ORAL_CAPSULE | Freq: Two times a day (BID) | ORAL | 0 refills | Status: AC

## 2021-07-23 NOTE — Progress Notes (Signed)
Carla Washington is a 65 y.o. 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     EPINEPHrine 0.3 mg/0.3 mL IJ SOAJ injection Inject 0.3 mLs (0.3 mg total) into the muscle as needed for anaphylaxis. 1 each 0   MAGNESIUM-ZINC PO Take 1 tablet by mouth daily at 6 (six) AM.     molnupiravir EUA (LAGEVRIO) 200 mg CAPS capsule Take 4 capsules (800 mg total) by mouth 2 (two) times daily for 5 days. 40 capsule 0   VITAMIN D PO Take 5,000 Units by mouth daily at 6 (six) AM.     VITAMIN K PO Take 1 tablet by mouth daily at 6 (six) AM.     No current facility-administered medications for this visit.    Allergies: Penicillins  Past Medical History:  Diagnosis Date   Abnormal C-reactive protein 10/14/2015   B12 deficiency 06/27/2018   on meds   Carotid bruit 11/01/2016   HLD (hyperlipidemia) 06/27/2018   diet controlled   Obesity 11/01/2016   Osteopenia 08/04/2009   DEXA 08/04/2009   Prediabetes 0000000   Systolic murmur 0000000   hx of   Vitamin D deficiency 06/27/2018   on meds    Past Surgical History:  Procedure Laterality Date   CESAREAN SECTION     x 3   COLONOSCOPY  07/04/2013   [place]   removal of ovarian cyst  08/04/1982   TONSILECTOMY/ADENOIDECTOMY WITH MYRINGOTOMY     TUBAL LIGATION     WISDOM TOOTH EXTRACTION      Family History  Problem Relation Age of Onset   Alzheimer's disease Mother    Heart disease Father    Ovarian cancer Sister 44   Colon cancer Sister 34       mets from ovaries   Pancreatic cancer Sister        mets from ovaries   Lung cancer Sister         mets from ovaries   Liver cancer Sister        mets from ovaries   Colon polyps Brother 51   Colon polyps Brother 54    Social History   Tobacco Use   Smoking status: Former    Packs/day: 0.01    Types: Cigarettes    Quit date: 06/25/1993    Years since quitting: 28.0   Smokeless tobacco: Never  Substance Use Topics   Alcohol use: Never    Subjective:    I connected with Carla Washington on 07/23/21 at  2:00 PM EST by a telephone call and verified that I am speaking with the correct person using two identifiers.   I discussed the limitations of evaluation and management by telemedicine and the availability of in person appointments. The patient expressed understanding and agreed to proceed. Provider in office/ patient is at home; provider and patient are only 2 people on telephone call.   Patient tested positive for COVID this am; notes she is actually already feeling better than last night when symptoms started; is wondering if she has the option to hold on taking anti-virals in case she  continues to improve; no chest pain, shortness of breath;     Objective:  Vitals:   07/23/21 1406  Weight: 190 lb (86.2 kg)  Height: 5\' 2"  (1.575 m)    Lungs: Respirations unlabored;  Neurologic: Alert and oriented; speech intact;    Assessment:  1. COVID-19     Plan:  Rx for Molnupiravir- instructions reviewed with patient; she understands she needs to start anti-viral within 5 days of onset of symptoms so it is reasonable if she wants to wait 24 hours to see if continues to improve; symptomatic treatment discussed, need for fluids;  Also encouraged for her to get established with new PCP as her MD has left office and patient has not been seen since 2020.  Time spent 11 minutes  No follow-ups on file.  No orders of the defined types were placed in this encounter.   Requested Prescriptions   Signed Prescriptions Disp Refills   molnupiravir EUA (LAGEVRIO) 200 mg CAPS capsule  40 capsule 0    Sig: Take 4 capsules (800 mg total) by mouth 2 (two) times daily for 5 days.

## 2021-08-19 ENCOUNTER — Encounter: Payer: BC Managed Care – PPO | Admitting: Family Medicine

## 2021-09-03 ENCOUNTER — Encounter: Payer: BC Managed Care – PPO | Admitting: Family Medicine

## 2021-09-20 ENCOUNTER — Encounter: Payer: BC Managed Care – PPO | Admitting: Family Medicine

## 2021-09-25 IMAGING — US US BREAST*R* LIMITED INC AXILLA
1 series · 11 of 11 positions shown · non-contrast
Comparison: Previous exam(s).

CLINICAL DATA: 65-year-old female for further evaluation of
possible RIGHT breast mass on screening mammogram.

EXAM:
DIGITAL DIAGNOSTIC UNILATERAL RIGHT MAMMOGRAM WITH TOMOSYNTHESIS AND
CAD; ULTRASOUND RIGHT BREAST LIMITED
TECHNIQUE: Right digital diagnostic mammography and breast tomosynthesis was
performed. The images were evaluated with computer-aided detection.;
Targeted ultrasound examination of the right breast was performed

[Series 1: us breast*right* limited inc axilla · 0.09mm/px · 11 of 11 slices shown]
[im 1/11]
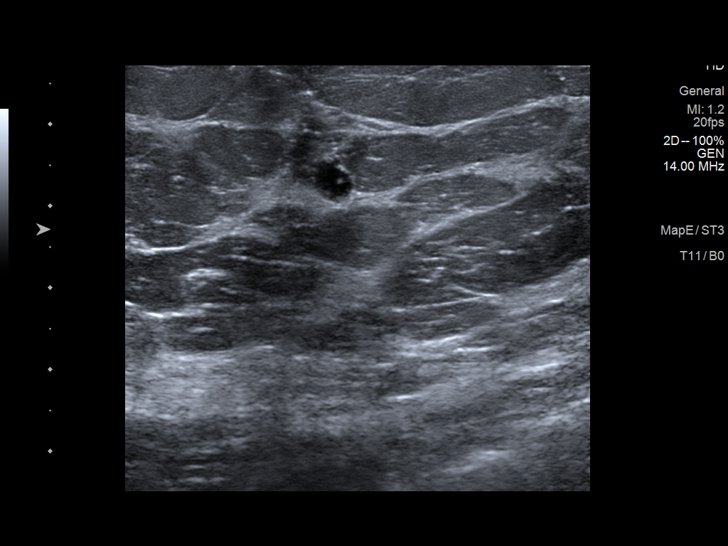
[im 2/11]
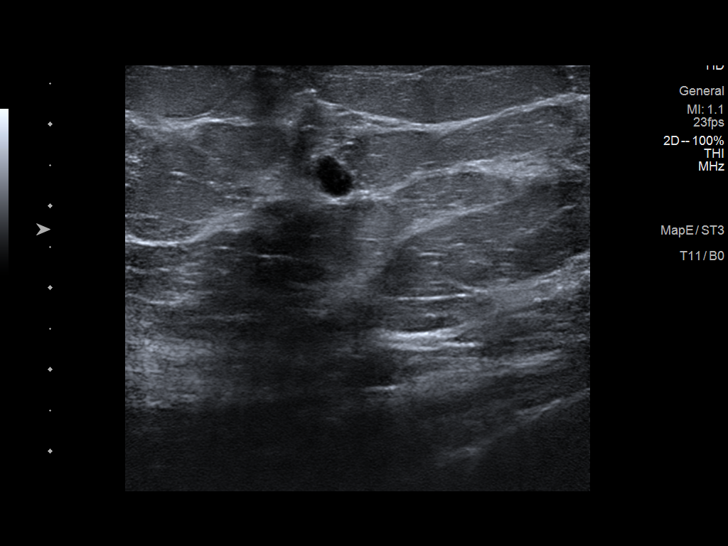
[im 3/11]
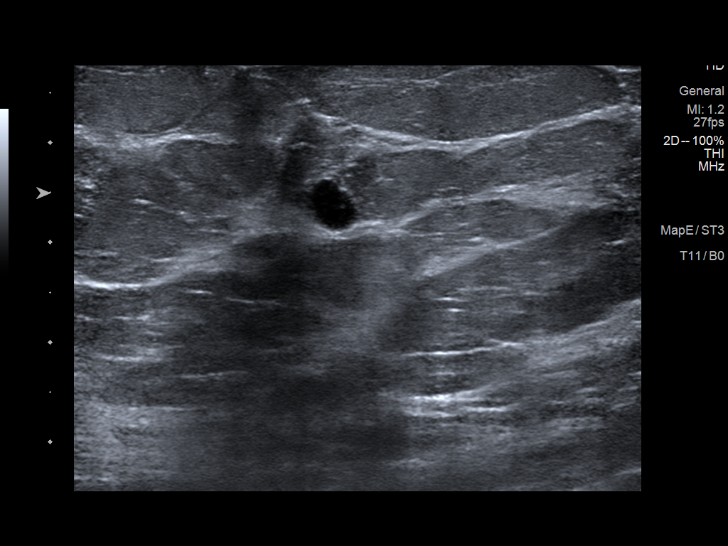
[im 4/11]
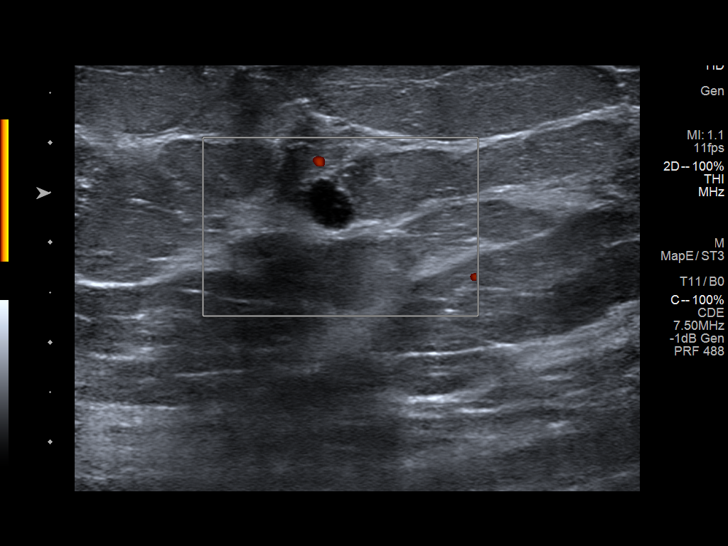
[im 5/11]
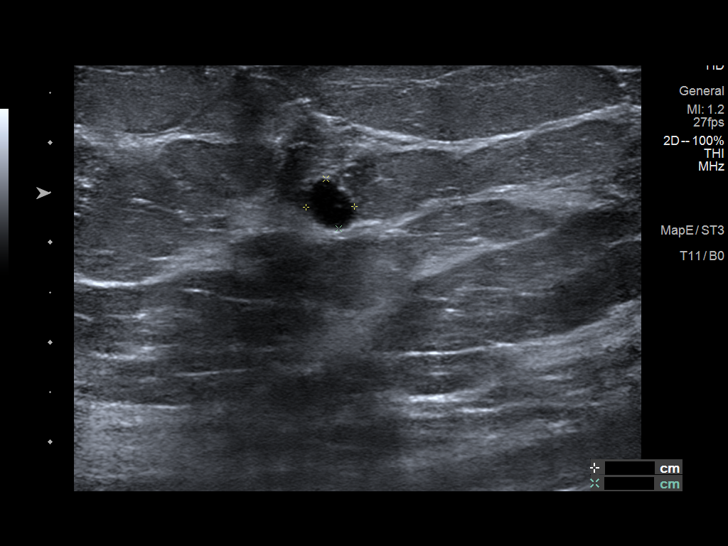
[im 6/11]
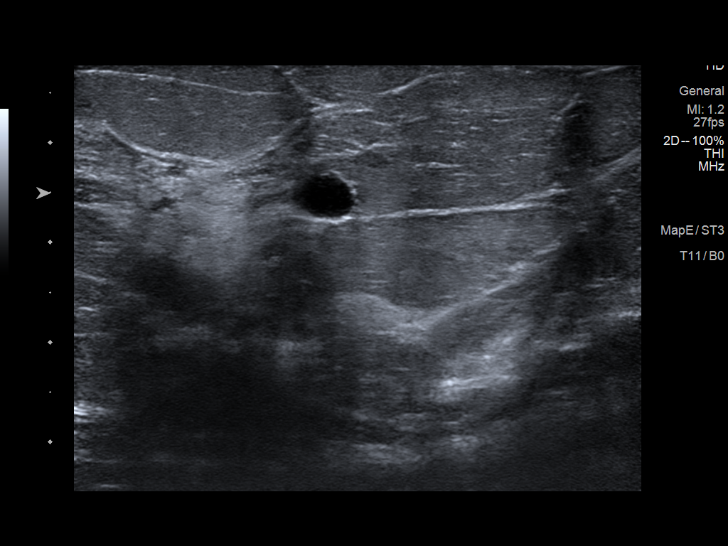
[im 7/11]
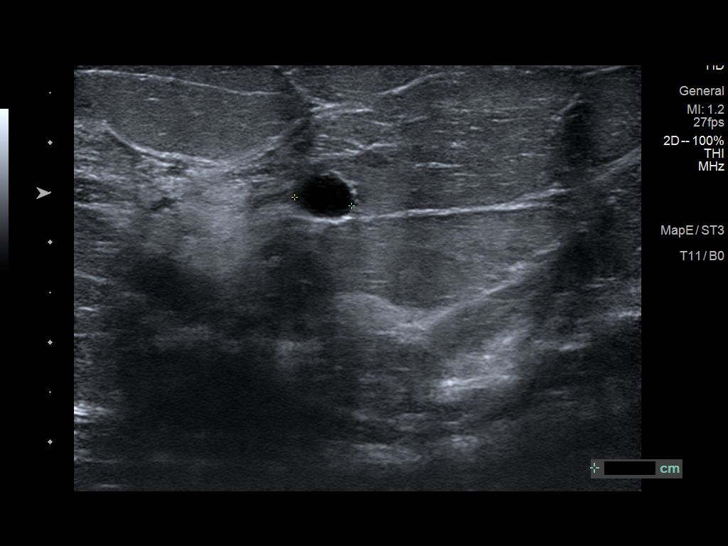
[im 8/11]
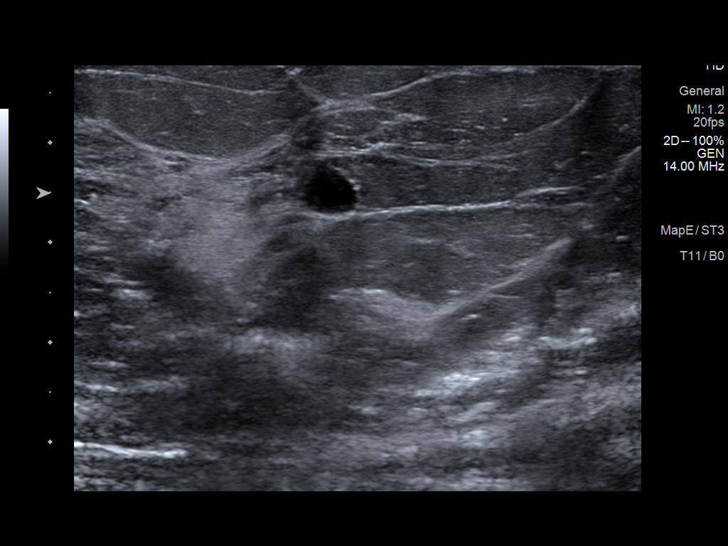
[im 9/11]
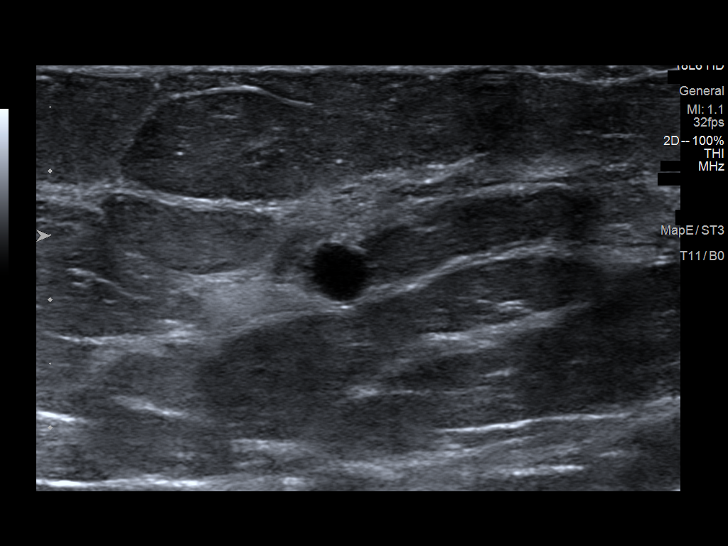
[im 10/11]
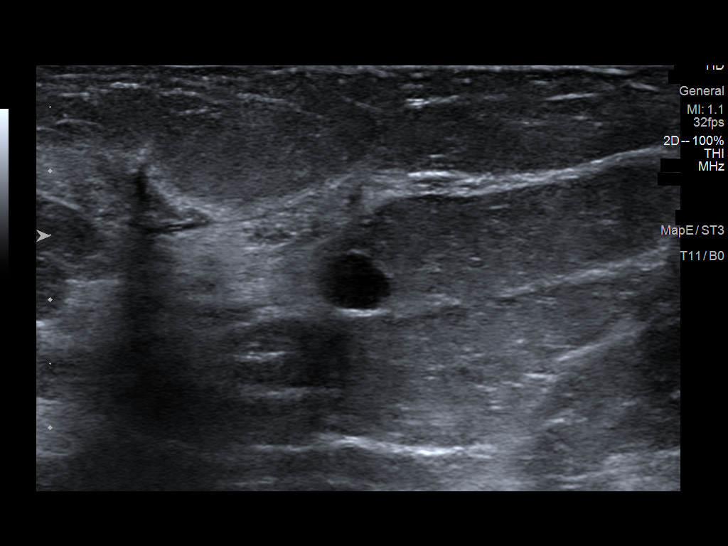
[im 11/11]
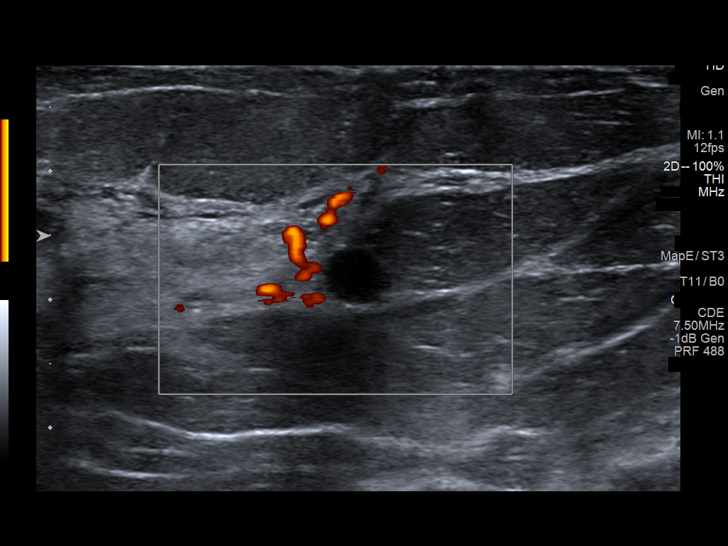

[11 of 11 positions shown; findings below may reference images not displayed]

ACR Breast Density Category b: There are scattered areas of
fibroglandular density.
FINDINGS: 2D/3D spot compression views of the RIGHT breast demonstrate a
persistent circumscribed oval mass within the UPPER RIGHT breast.

Targeted ultrasound is performed, showing a 0.5 x 0.5 x 0.6 cm
circumscribed anechoic mass without internal vascular flow at the 12
o'clock position of the RIGHT breast 2 cm from the nipple,
compatible with a benign cyst and corresponds to the screening study
finding.
IMPRESSION: Benign cyst within the UPPER RIGHT breast corresponding to the
screening study finding.

RECOMMENDATION:
Bilateral screening mammogram in 1 year.

I have discussed the findings and recommendations with the patient.
If applicable, a reminder letter will be sent to the patient
regarding the next appointment.

BI-RADS CATEGORY  2: Benign.

## 2021-09-25 IMAGING — MG MM DIGITAL DIAGNOSTIC UNILAT*R* W/ TOMO W/ CAD
4 series · 4 of 12 positions shown · non-contrast
Comparison: Previous exam(s).

CLINICAL DATA: 65-year-old female for further evaluation of
possible RIGHT breast mass on screening mammogram.

EXAM:
DIGITAL DIAGNOSTIC UNILATERAL RIGHT MAMMOGRAM WITH TOMOSYNTHESIS AND
CAD; ULTRASOUND RIGHT BREAST LIMITED
TECHNIQUE: Right digital diagnostic mammography and breast tomosynthesis was
performed. The images were evaluated with computer-aided detection.;
Targeted ultrasound examination of the right breast was performed

[R MLO synth-2D]
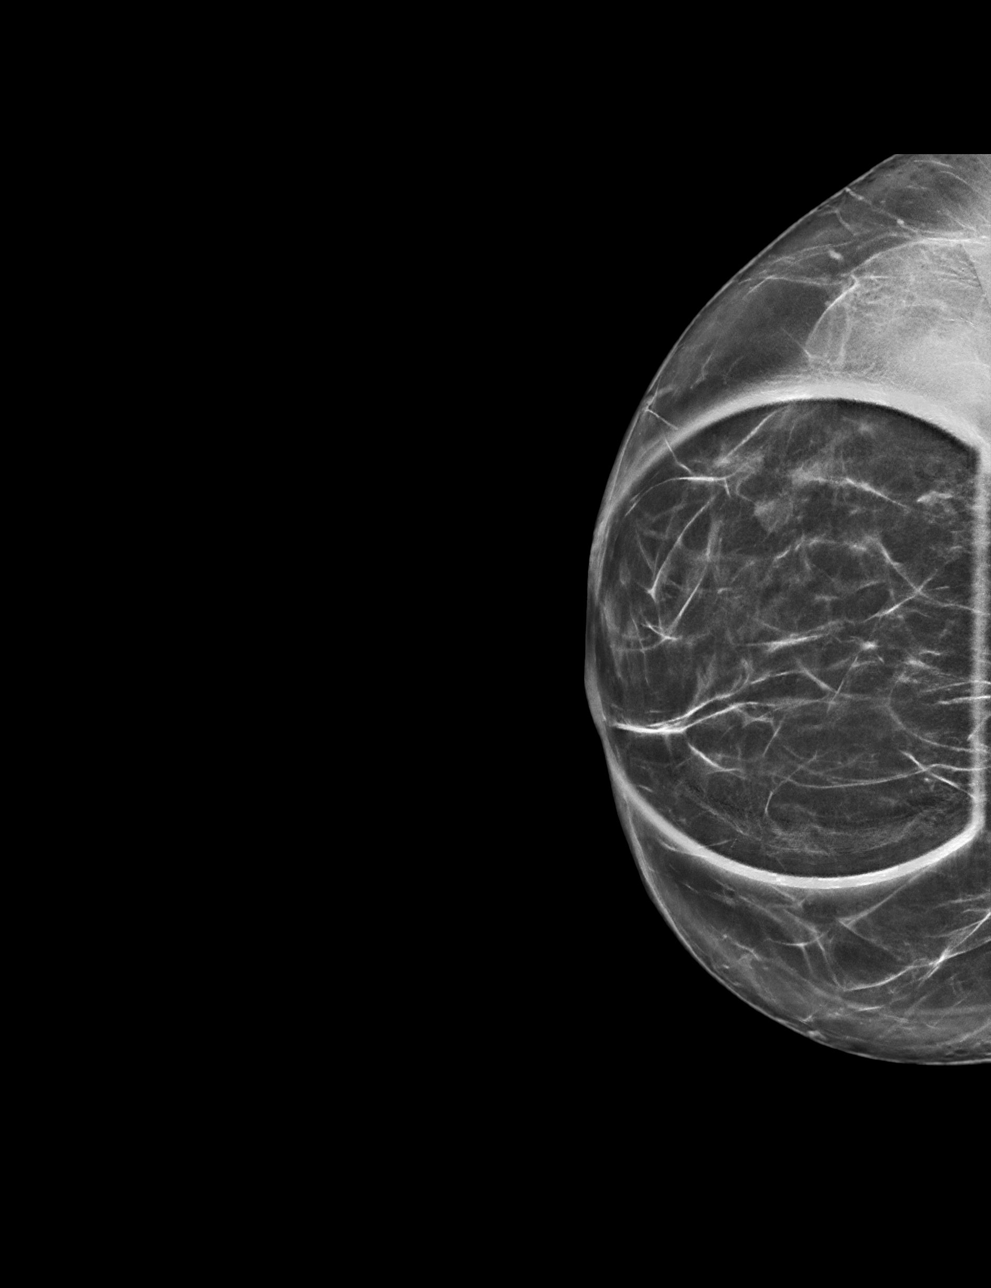

[R CC synth-2D]
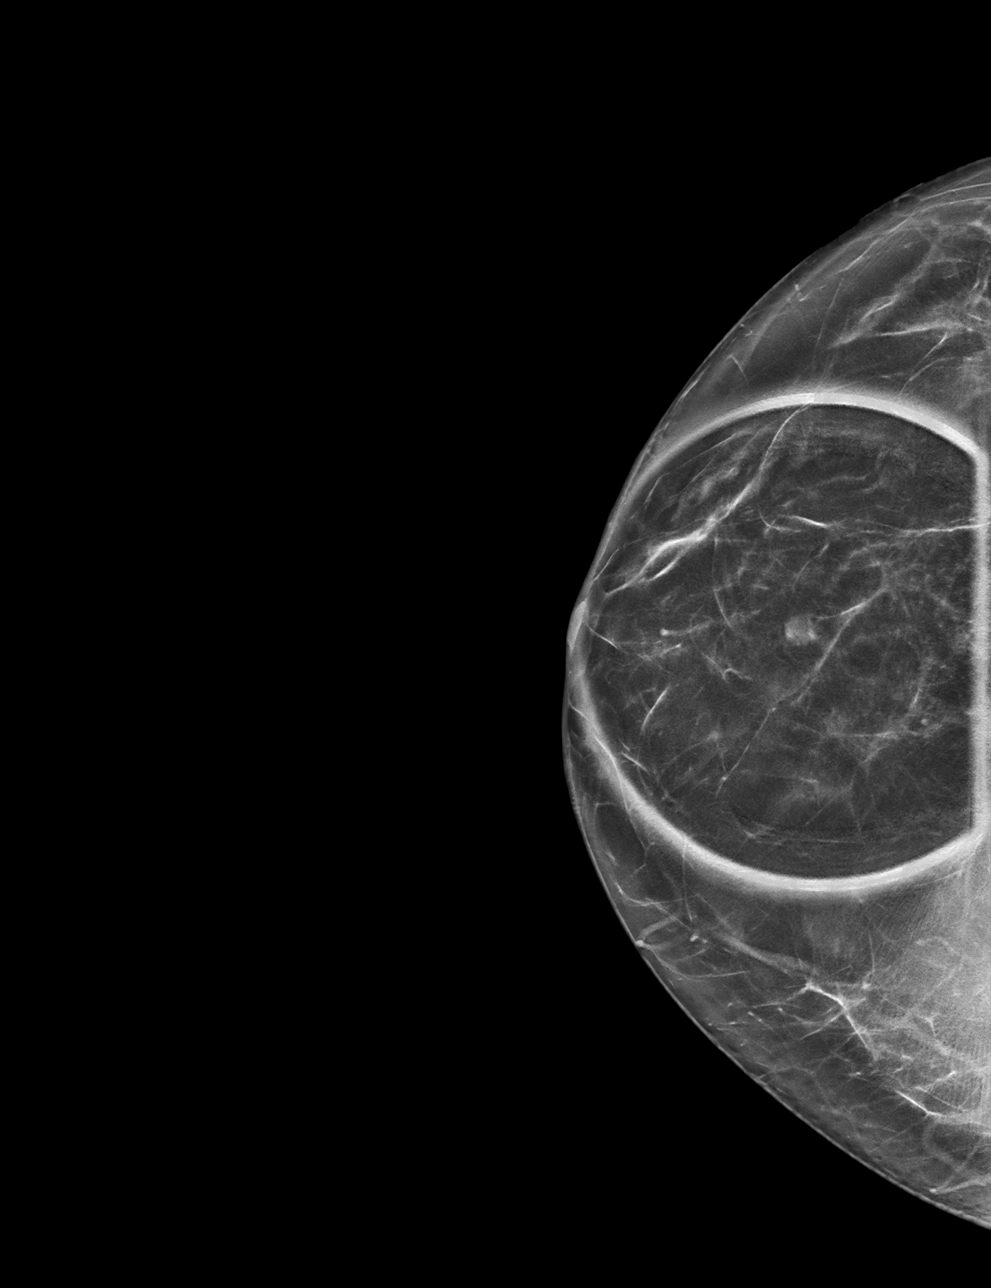

[R MLO tomo · tomo slice 33/65.0]
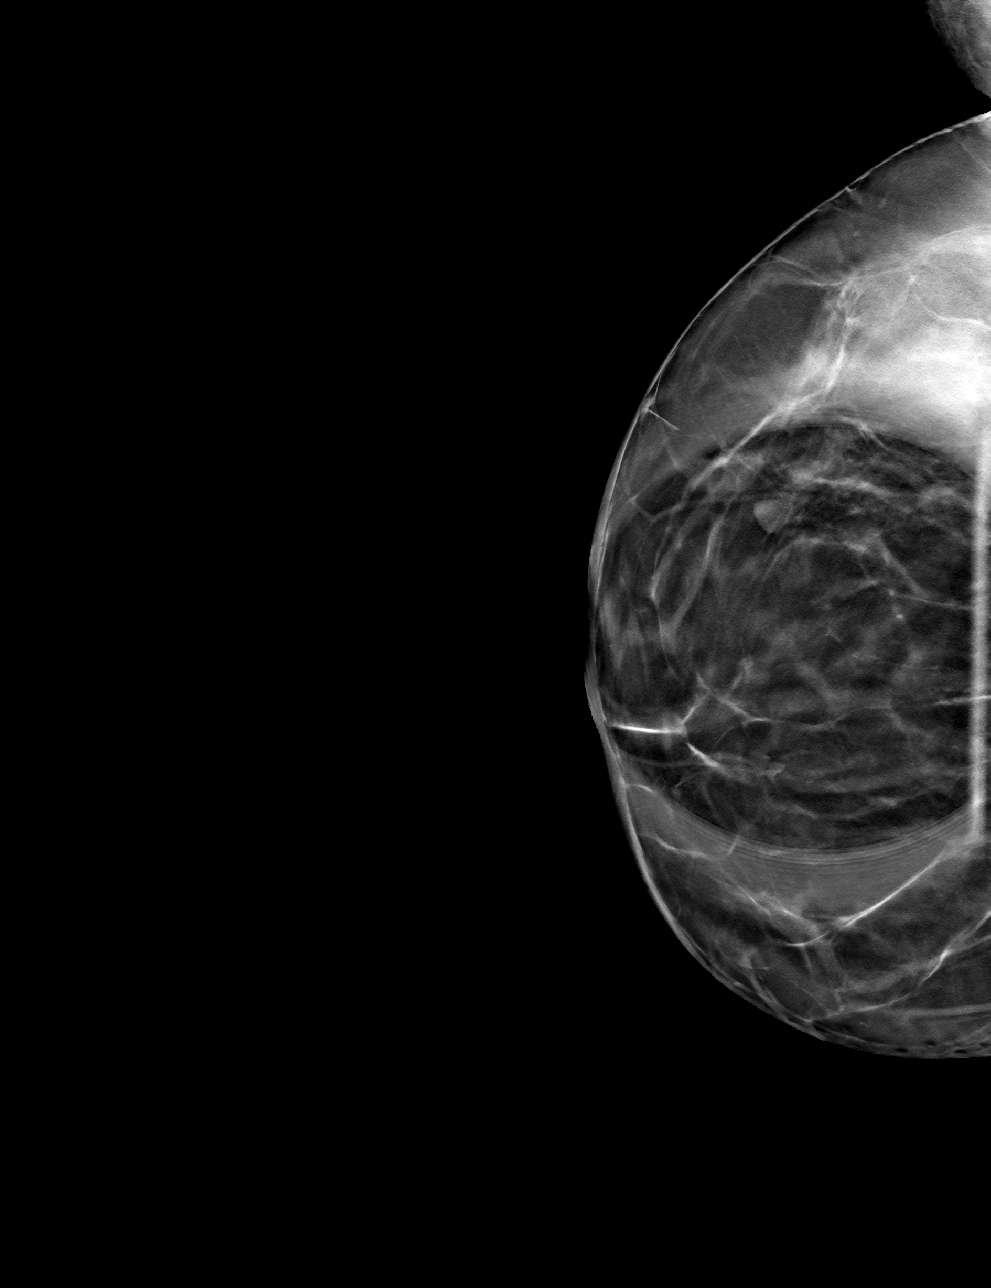

[R CC tomo · tomo slice 30/59.0]
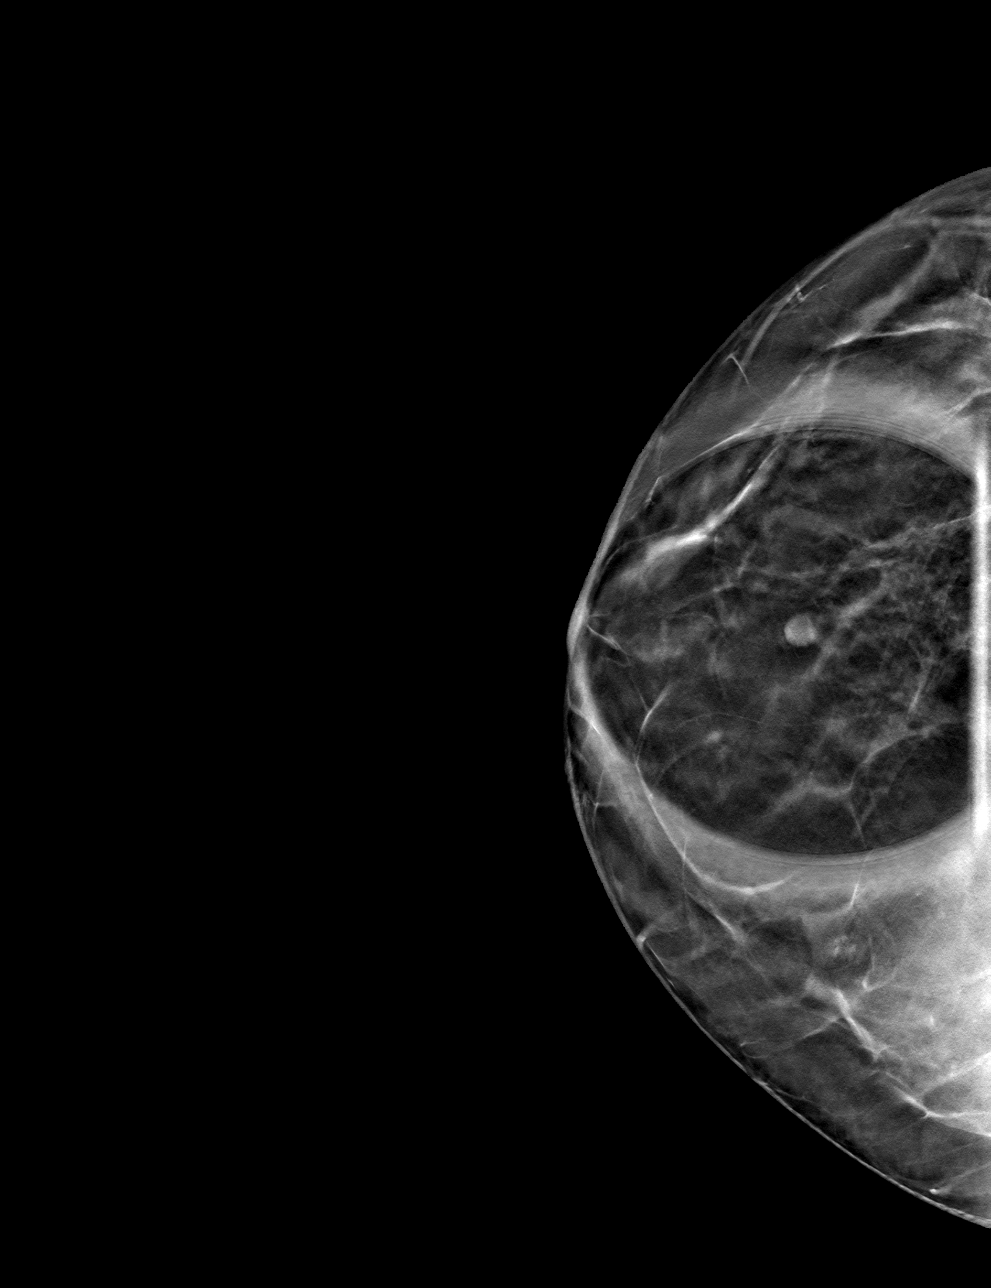

[4 of 12 positions shown; findings below may reference images not displayed]

ACR Breast Density Category b: There are scattered areas of
fibroglandular density.
FINDINGS: 2D/3D spot compression views of the RIGHT breast demonstrate a
persistent circumscribed oval mass within the UPPER RIGHT breast.

Targeted ultrasound is performed, showing a 0.5 x 0.5 x 0.6 cm
circumscribed anechoic mass without internal vascular flow at the 12
o'clock position of the RIGHT breast 2 cm from the nipple,
compatible with a benign cyst and corresponds to the screening study
finding.
IMPRESSION: Benign cyst within the UPPER RIGHT breast corresponding to the
screening study finding.

RECOMMENDATION:
Bilateral screening mammogram in 1 year.

I have discussed the findings and recommendations with the patient.
If applicable, a reminder letter will be sent to the patient
regarding the next appointment.

BI-RADS CATEGORY  2: Benign.

## 2022-01-10 ENCOUNTER — Encounter: Payer: BC Managed Care – PPO | Admitting: Family

## 2022-01-27 DIAGNOSIS — M25571 Pain in right ankle and joints of right foot: Secondary | ICD-10-CM | POA: Diagnosis not present

## 2022-02-05 DIAGNOSIS — L308 Other specified dermatitis: Secondary | ICD-10-CM | POA: Diagnosis not present

## 2022-02-05 DIAGNOSIS — L570 Actinic keratosis: Secondary | ICD-10-CM | POA: Diagnosis not present

## 2022-02-05 DIAGNOSIS — B351 Tinea unguium: Secondary | ICD-10-CM | POA: Diagnosis not present

## 2022-02-05 DIAGNOSIS — X32XXXA Exposure to sunlight, initial encounter: Secondary | ICD-10-CM | POA: Diagnosis not present

## 2022-10-20 ENCOUNTER — Ambulatory Visit: Payer: 59 | Admitting: Family Medicine

## 2022-10-20 ENCOUNTER — Encounter: Payer: Self-pay | Admitting: Family Medicine

## 2022-10-20 VITALS — BP 122/70 | HR 61 | Temp 98.0°F | Ht 62.0 in | Wt 200.4 lb

## 2022-10-20 DIAGNOSIS — Z1211 Encounter for screening for malignant neoplasm of colon: Secondary | ICD-10-CM | POA: Diagnosis not present

## 2022-10-20 DIAGNOSIS — Z0000 Encounter for general adult medical examination without abnormal findings: Secondary | ICD-10-CM | POA: Diagnosis not present

## 2022-10-20 NOTE — Patient Instructions (Signed)

## 2022-10-20 NOTE — Progress Notes (Signed)
Phone [phone]   Subjective:   Patient is a 67 y.o. female presenting for annual physical.    Chief Complaint  Patient presents with   Transfer of Care    Transfer of Care    Annual ate 5 hrs ago.  Walks if weather good.  Healthy diet.   See problem oriented charting- ROS- ROS: Gen: no fever, chills  Skin: no rash, itching ENT: no ear pain, ear drainage, nasal congestion, rhinorrhea, sinus pressure, sore throat Eyes: no blurry vision, double vision Resp: no cough, wheeze,SOB.  Some DOE uphill since gained wt.  CV: no CP, palpitations, LE edema,  had w/u for palp/heart in past.  Ok now.  Occ hard beat.  GI: no heartburn, n/v/d/c, abd pain GU: no dysuria, urgency, frequency, hematuria MSK: some arth. Neuro: no dizziness, headache, weakness, vertigo Psych: no depression, anxiety, insomnia, SI   occ feeling blue/lonely.   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[place]) 09/13/2020   Palpitations 06/16/2019   Snoring 06/16/2019   Vitamin D deficiency 06/27/2018   B12 deficiency 06/27/2018   HLD (hyperlipidemia) 06/27/2018   Past Surgical History:  Procedure Laterality Date   CESAREAN SECTION     x 3   COLONOSCOPY  07/04/2013   [place]   removal of ovarian cyst  08/04/1982   TONSILECTOMY/ADENOIDECTOMY WITH MYRINGOTOMY     TUBAL LIGATION     WISDOM TOOTH EXTRACTION      Family History  Problem Relation Age of Onset   Alzheimer's disease Mother    Heart disease  Father 39   Ovarian cancer Sister 78   Colon cancer Sister 91       mets from ovaries   Pancreatic cancer Sister        mets from ovaries   Lung cancer Sister        mets from ovaries   Liver cancer Sister        mets from ovaries   Heart disease Brother 60 - 68   Colon polyps Brother 58   Colon polyps Brother 15    Medications- reviewed and updated Current Outpatient Medications  Medication Sig Dispense Refill   ASCORBIC ACID PO Take by mouth daily. (Patient not taking: Reported on 10/20/2022)     EPINEPHrine 0.3 mg/0.3 mL IJ SOAJ injection Inject 0.3 mLs (0.3 mg total) into the muscle as needed for anaphylaxis. (Patient not taking: Reported on 10/20/2022) 1 each 0   MAGNESIUM PO Take by mouth. (Patient not taking: Reported on 10/20/2022)     MAGNESIUM-ZINC PO Take 1 tablet by mouth daily at 6 (six) AM. (Patient not taking: Reported on 10/20/2022)     Melatonin 10 MG TABS Take by mouth. (Patient not taking: Reported on 10/20/2022)     VITAMIN D PO Take 5,000 Units by mouth daily at 6 (six) AM. (Patient not taking: Reported on 10/20/2022)     VITAMIN K PO Take 1 tablet by mouth daily at  6 (six) AM. (Patient not taking: Reported on 10/20/2022)     No current facility-administered medications for this visit.    Allergies-reviewed and updated Allergies  Allergen Reactions   Penicillins Hives    Social History   Social History Narrative   3 grands   Objective  Objective:  BP 122/70   Pulse 61   Temp 98 F (36.7 C) (Temporal)   Ht 5\' 2"  (1.575 m)   Wt 200 lb 6 oz (90.9 kg)   SpO2 99%   BMI 36.65 kg/m  Physical Exam  Gen: WDWN NAD HEENT: NCAT, conjunctiva not injected, sclera nonicteric TM WNL B, OP moist, no exudates  NECK:  supple, no thyromegaly, no nodes, no carotid bruits CARDIAC: RRR, S1S2+, no murmur. DP 2+B LUNGS: CTAB. No wheezes ABDOMEN:  BS+, soft, NTND, No HSM, no masses EXT:  no edema MSK: no gross abnormalities. MS 5/5 all 4 NEURO: A&O x3.  CN II-XII  intact.  PSYCH: normal mood. Good eye contact    Assessment and Plan   Health Maintenance counseling: 1. Anticipatory guidance: Patient counseled regarding regular dental exams q6 months, eye exams,  avoiding smoking and second hand smoke, limiting alcohol to 1 beverage per day, no illicit drugs.   2. Risk factor reduction:  Advised patient of need for regular exercise and diet rich and fruits and vegetables to reduce risk of heart attack and stroke. Exercise- intermitt.  Wt Readings from Last 3 Encounters:  10/20/22 200 lb 6 oz (90.9 kg)  07/23/21 190 lb (86.2 kg)  12/27/20 201 lb 12.8 oz (91.5 kg)   3. Immunizations/screenings/ancillary studies  There is no immunization history on file for this patient. Health Maintenance Due  Topic Date Due   DTaP/Tdap/Td (1 - Tdap) Never done    4. Cervical cancer screening- utd 5. Breast cancer screening-  mammogram utd 6. Colon cancer screening - utd 7. Skin cancer screening- advised regular sunscreen use. Denies worrisome, changing, or new skin lesions.  8. Birth control/STD check- n/a 9. Osteoporosis screening- utd 10. Smoking associated screening - former smoker  Problem List Items Addressed This Visit   None Visit Diagnoses     Wellness examination    -  Primary   Screen for colon cancer       Relevant Orders   Ambulatory referral to Gastroenterology      [facility] guidance.  Work on [employer].  Check CBC,CMP,lipids,TSH, A1C.  F/u 1 yr  declines immunizations  Recommended follow up: annual No follow-ups on file. No future appointments.  Lab/Order associations:5 hr fasting   ICD-10-CM   1. Wellness examination  Z00.00     2. Screen for colon cancer  Z12.11 Ambulatory referral to Gastroenterology      No orders of the defined types were placed in this encounter.   Wellington Hampshire, MD

## 2022-10-21 LAB — LIPID PANEL
Cholesterol: 266 mg/dL — ABNORMAL HIGH (ref 0–200)
HDL: 56.1 mg/dL (ref >39.00)
LDL Cholesterol: 176 mg/dL — ABNORMAL HIGH (ref 0–99)
NonHDL: 209.5
Total CHOL/HDL Ratio: 5
Triglycerides: 169 mg/dL — ABNORMAL HIGH (ref 0.0–149.0)
VLDL: 33.8 mg/dL (ref 0.0–40.0)

## 2022-10-21 LAB — CBC WITH DIFFERENTIAL/PLATELET
Basophils Absolute: 0 10*3/uL (ref 0.0–0.1)
Basophils Relative: 0.9 % (ref 0.0–3.0)
Eosinophils Absolute: 0.2 10*3/uL (ref 0.0–0.7)
Eosinophils Relative: 4.8 % (ref 0.0–5.0)
HCT: 40.9 % (ref 36.0–46.0)
Hemoglobin: 13.4 g/dL (ref 12.0–15.0)
Lymphocytes Relative: 33.3 % (ref 12.0–46.0)
Lymphs Abs: 1.7 10*3/uL (ref 0.7–4.0)
MCHC: 32.9 g/dL (ref 30.0–36.0)
MCV: 78.2 fl (ref 78.0–100.0)
Monocytes Absolute: 0.4 10*3/uL (ref 0.1–1.0)
Monocytes Relative: 8.6 % (ref 3.0–12.0)
Neutro Abs: 2.7 10*3/uL (ref 1.4–7.7)
Neutrophils Relative %: 52.4 % (ref 43.0–77.0)
Platelets: 266 10*3/uL (ref 150.0–400.0)
RBC: 5.24 Mil/uL — ABNORMAL HIGH (ref 3.87–5.11)
RDW: 15 % (ref 11.5–15.5)
WBC: 5.2 10*3/uL (ref 4.0–10.5)

## 2022-10-21 LAB — COMPREHENSIVE METABOLIC PANEL
ALT: 19 U/L (ref 0–35)
AST: 21 U/L (ref 0–37)
Albumin: 4.1 g/dL (ref 3.5–5.2)
Alkaline Phosphatase: 75 U/L (ref 39–117)
BUN: 16 mg/dL (ref 6–23)
CO2: 26 mEq/L (ref 19–32)
Calcium: 9.9 mg/dL (ref 8.4–10.5)
Chloride: 103 mEq/L (ref 96–112)
Creatinine, Ser: 0.77 mg/dL (ref 0.40–1.20)
GFR: 80.56 mL/min (ref >60.00)
Glucose, Bld: 105 mg/dL — ABNORMAL HIGH (ref 70–99)
Potassium: 4.5 mEq/L (ref 3.5–5.1)
Sodium: 141 mEq/L (ref 135–145)
Total Bilirubin: 0.5 mg/dL (ref 0.2–1.2)
Total Protein: 6.7 g/dL (ref 6.0–8.3)

## 2022-10-21 LAB — TSH: TSH: 1.46 u[IU]/mL (ref 0.35–5.50)

## 2022-10-21 LAB — HEMOGLOBIN A1C: Hgb A1c MFr Bld: 5.9 % (ref 4.6–6.5)

## 2022-10-22 NOTE — Progress Notes (Signed)
Labs okay except: 1.A1C(3 month average of sugars) is elevated.  This is considered PreDiabetes.  Work on diet-decrease sugars and starches and aim for 30 minutes of exercise 5 days/week to prevent progression to diabetes 2.  Cholesterol has gotten worse.  Her calcium score was 02 years ago.  However, LDL is greater than 160.  Guideline at this stage is taking a statin.  If she willing?

## 2022-10-29 ENCOUNTER — Encounter: Payer: Self-pay | Admitting: Family Medicine

## 2023-01-06 ENCOUNTER — Encounter: Payer: Self-pay | Admitting: Podiatry

## 2023-01-06 ENCOUNTER — Ambulatory Visit (INDEPENDENT_AMBULATORY_CARE_PROVIDER_SITE_OTHER): Payer: 59

## 2023-01-06 ENCOUNTER — Ambulatory Visit: Payer: 59 | Admitting: Podiatry

## 2023-01-06 DIAGNOSIS — M722 Plantar fascial fibromatosis: Secondary | ICD-10-CM | POA: Diagnosis not present

## 2023-01-06 MED ORDER — MELOXICAM 15 MG PO TABS: 15.0000 mg | ORAL_TABLET | Freq: Every day | ORAL | 3 refills | Status: DC

## 2023-01-06 MED ORDER — TRIAMCINOLONE ACETONIDE 40 MG/ML IJ SUSP
20.0000 mg | Freq: Once | INTRAMUSCULAR | Status: AC
Start: 2023-01-06 — End: 2023-01-06
  Administered 2023-01-06: 20 mg

## 2023-01-06 MED ORDER — METHYLPREDNISOLONE 4 MG PO TBPK: ORAL_TABLET | ORAL | 0 refills | Status: DC

## 2023-01-07 NOTE — Progress Notes (Signed)
Subjective:  Patient ID: Carla Washington, female    DOB: 1955/09/29,  MRN: [id number] HPI Chief Complaint  Patient presents with   Foot Pain    Plantar arch and heel left - aching x few months, has been exercising a lot lately, now noticing more pain in just the heel, tried stretching and ice, history of PF  Lateral heel right - sharp, shooting burning sensation intermittent, has seen Dr. Juliene Pina in the past for an injury to that same foot   New Patient (Initial Visit)    67 y.o. female presents with the above complaint.   ROS: Denies fever chills nausea vomit muscle aches pains calf pain back pain chest pain shortness of breath.  Past Medical History:  Diagnosis Date   Abnormal C-reactive protein 10/14/2015   B12 deficiency 06/27/2018   on meds   Carotid bruit 11/01/2016   HLD (hyperlipidemia) 06/27/2018   diet controlled   Obesity 11/01/2016   Osteopenia 08/04/2009   DEXA 08/04/2009   Prediabetes 10/14/2015   Systolic murmur 11/01/2016   hx of   Vitamin D deficiency 06/27/2018   on meds   Past Surgical History:  Procedure Laterality Date   CESAREAN SECTION     x 3   COLONOSCOPY  07/04/2013   [place]   removal of ovarian cyst  08/04/1982   TONSILECTOMY/ADENOIDECTOMY WITH MYRINGOTOMY     TUBAL LIGATION     WISDOM TOOTH EXTRACTION      Current Outpatient Medications:    meloxicam (MOBIC) 15 MG tablet, Take 1 tablet (15 mg total) by mouth daily., Disp: 30 tablet, Rfl: 3   methylPREDNISolone (MEDROL DOSEPAK) 4 MG TBPK tablet, 6 day dose pack - take as directed, Disp: 21 tablet, Rfl: 0   MAGNESIUM PO, Take by mouth. (Patient not taking: Reported on 10/20/2022), Disp: , Rfl:    MAGNESIUM-ZINC PO, Take 1 tablet by mouth daily at 6 (six) AM. (Patient not taking: Reported on 10/20/2022), Disp: , Rfl:    VITAMIN D PO, Take 5,000 Units by mouth daily at 6 (six) AM. (Patient not taking: Reported on 10/20/2022), Disp: , Rfl:    VITAMIN K PO, Take 1 tablet by mouth daily at 6  (six) AM. (Patient not taking: Reported on 10/20/2022), Disp: , Rfl:   Allergies  Allergen Reactions   Penicillins Hives   Review of Systems Objective:  There were no vitals filed for this visit.  General: Well developed, nourished, in no acute distress, alert and oriented x3   Dermatological: Skin is warm, dry and supple bilateral. Nails x 10 are well maintained; remaining integument appears unremarkable at this time. There are no open sores, no preulcerative lesions, no rash or signs of infection present.  Vascular: Dorsalis Pedis artery and Posterior Tibial artery pedal pulses are 2/4 bilateral with immedate capillary fill time. Pedal hair growth present. No varicosities and no lower extremity edema present bilateral.   Neruologic: Grossly intact via light touch bilateral. Vibratory intact via tuning fork bilateral. Protective threshold with Semmes Wienstein monofilament intact to all pedal sites bilateral. Patellar and Achilles deep tendon reflexes 2+ bilateral. No Babinski or clonus noted bilateral.  Radiating electrical type pain along the lateral aspect of the right ankle most likely cervical distribution.  Possibly associated previous trauma.  Musculoskeletal: No gross boney pedal deformities bilateral. No pain, crepitus, or limitation noted with foot and ankle range of motion bilateral. Muscular strength 5/5 in all groups tested bilateral.  Pain on palpation medial calcaneal tubercle of the left  heel.  No pain on medial lateral compression of the calcaneus.  No pain on palpation of lateral aspect of the foot or the Achilles.  Gait: Unassisted, Nonantalgic.    Radiographs:  Radiographs taken today demonstrate osseously mature foot good bone mineralization soft tissue increase in density plantar fashion calcaneal insertion site of the left heel.  Assessment & Plan:   Assessment: Plan fasciitis left  Plan: Injected the left heel today with 10 mg Kenalog 5 mg Marcaine.  Started on  methylprednisolone to be followed by meloxicam 15 mg tablets.  She will take 1 daily.  Dispensed a plantar fascial brace.  Discussed appropriate shoe gear.  And I will follow-up with her in 4 to 6 weeks if necessary.     Tylah Mancillas T. [place], [place]

## 2023-12-30 DIAGNOSIS — H2513 Age-related nuclear cataract, bilateral: Secondary | ICD-10-CM | POA: Diagnosis not present

## 2024-04-08 ENCOUNTER — Encounter: Payer: Self-pay | Admitting: Physician Assistant

## 2024-04-08 ENCOUNTER — Ambulatory Visit: Payer: Self-pay

## 2024-04-08 ENCOUNTER — Ambulatory Visit (INDEPENDENT_AMBULATORY_CARE_PROVIDER_SITE_OTHER): Admitting: Physician Assistant

## 2024-04-08 VITALS — BP 136/80 | HR 62 | Temp 97.2°F | Ht 62.0 in | Wt 192.2 lb

## 2024-04-08 DIAGNOSIS — R21 Rash and other nonspecific skin eruption: Secondary | ICD-10-CM | POA: Diagnosis not present

## 2024-04-08 MED ORDER — METHYLPREDNISOLONE ACETATE 80 MG/ML IJ SUSP
80.0000 mg | Freq: Once | INTRAMUSCULAR | Status: AC
Start: 2024-04-08 — End: 2024-04-08
  Administered 2024-04-08: 80 mg via INTRAMUSCULAR

## 2024-04-08 MED ORDER — PREDNISONE 10 MG PO TABS: ORAL_TABLET | ORAL | 0 refills | Status: DC

## 2024-04-08 MED ORDER — EPINEPHRINE 0.3 MG/0.3ML IJ SOAJ: 0.3000 mg | INTRAMUSCULAR | 1 refills | Status: AC | PRN

## 2024-04-08 NOTE — Telephone Encounter (Signed)
 FYI Only or Action Required?: FYI only for provider.  Patient was last seen in primary care on 10/20/2022 by [name], MD.  Called Nurse Triage reporting Rash.  Symptoms began several days ago.  Interventions attempted: OTC medications: Benadryl, Claritin, Calamine lotion.  Symptoms are: gradually worsening.  Triage Disposition: See Physician Within 24 Hours  Patient/caregiver understands and will follow disposition?: Yes      Copied from CRM [id number]. Topic: Clinical - Red Word Triage >> Apr 08, 2024 10:02 AM [name] wrote: Red Word that prompted transfer to Nurse Triage: Patients 3rd day with hives keeps spreading in arms, jaw, chest and shoulders/ neck. They have liquid as well some of them. After she ate [place] food, Tuesday night. Voice was horse as well. Reason for Disposition  SEVERE itching (i.e., interferes with sleep, normal activities or school)  Answer Assessment - Initial Assessment Questions The rash started from her thumb and moved up her arm to jaw line to her face and chest. This morning the rash had spread to her neck and other arm. She states the area is very itchy and has some raised areas. Denis SOB or chest pain. She states her voice has changed and gotten hoarse. Currently using calamine lotion, Benadryl and Claritin for symptoms.    1. APPEARANCE of RASH: What does the rash look like? (e.g., blisters, dry flaky skin, red spots, redness, sores)     Redness with liquid  2. SIZE: How big are the spots? (e.g., tip of pen, eraser, coin; inches, centimeters)     Size of a tip of a pen and half of a dime size (varies) 3. LOCATION: Where is the rash located?     All over her body  4. COLOR: What color is the rash? (Note: It is difficult to assess rash color in people with darker-colored skin. When this situation occurs, simply ask the caller to describe what they see.)     Redness  5. ONSET: When did the rash begin?     Tuesday night  6.  FEVER: Do you have a fever? If Yes, ask: What is your temperature, how was it measured, and when did it start?     Deneis 7. ITCHING: Does the rash itch? If Yes, ask: How bad is the itch? (Scale 1-10; or mild, moderate, severe)     8 or 9  8. CAUSE: What do you think is causing the rash?     Ingredient in [place] food  9. OTHER SYMPTOMS: Do you have any other symptoms? (e.g., dizziness, headache, sore throat, joint pain)       Denies  Protocols used: Rash or Redness - [facility]

## 2024-04-08 NOTE — Patient Instructions (Addendum)
 VISIT SUMMARY: During your visit, we addressed your widespread rash and throat symptoms, which appear to be related to allergic contact dermatitis with systemic symptoms. You received a steroid injection and were prescribed medications to manage your symptoms. We also discussed the importance of having an updated EpiPen  and referred you to an allergist for further evaluation.  YOUR PLAN: ALLERGIC CONTACT DERMATITIS WITH SYSTEMIC SYMPTOMS: You have a rash and throat irritation likely due to an allergic reaction, possibly from high-histamine foods and allergens. -You received a steroid injection to quickly control your symptoms. -Take low-dose oral prednisone  for 1-2 weeks to prevent the rash from coming back. -Continue taking Claritin daily. -Use Benadryl as needed for additional symptom control. -Start taking 20 mg famotidine/Pepcid as an additional antihistamine. -Your EpiPen  prescription has been refilled; make sure to keep it with you for emergencies. -You have been referred to an allergist for comprehensive allergy testing. -Take pictures of your rash to show the allergist. -Seek emergency care if your symptoms get worse, especially if you have throat swelling or difficulty breathing.    I will refer you to [facility] [address], [place], [place] [address]                  Contains text generated by Abridge.                                 Contains text generated by Abridge.

## 2024-04-08 NOTE — Telephone Encounter (Signed)
 Has appointment with Lucie today.

## 2024-04-08 NOTE — Progress Notes (Signed)
 Carla Washington is a 68 y.o. female here for a follow up of a pre-existing problem.  History of Present Illness:   Chief Complaint  Patient presents with  . Allergic Reaction    Pt said she is having an allergic reaction to food from [employer], started Tuesday night having hives, red rash on chest , itching. Pt is taking Benadryl and Claritin.    Discussed the use of AI scribe software for clinical note transcription with the patient, who gave verbal consent to proceed.  History of Present Illness Carla Washington is a 68 year old female with a history of histamine intolerance who presents with a widespread rash and throat symptoms.  After returning from [place] on August 1st, she consumed food from [employer] and developed an itchy bump on her chest within an hour. The rash worsened and spread to her arms, jawline, and throat, becoming itchy and blistery with initial burning sensations. She uses calamine lotion for relief. Throat symptoms include frequent clearing and hoarseness, raising concerns about breathing difficulties.   She takes Claritin daily and started Benadryl, taking two 25 mg tablets last night.   Her EpiPen  is expired and unused recently. She cleaned her yard without gloves, potentially exposing her to allergens. No rash on her legs, but burning in the chest area has resolved, possibly due to medication.    Past Medical History:  Diagnosis Date  . Abnormal C-reactive protein 10/14/2015  . B12 deficiency 06/27/2018   on meds  . Carotid bruit 11/01/2016  . HLD (hyperlipidemia) 06/27/2018   diet controlled  . Obesity 11/01/2016  . Osteopenia 08/04/2009   DEXA 08/04/2009  . Prediabetes 10/14/2015  . Systolic murmur 11/01/2016   hx of  . Vitamin D  deficiency 06/27/2018   on meds     Social History   Tobacco Use  . Smoking status: Former    Current packs/day: 0.00    Types: Cigarettes    Quit date: 06/25/1993    Years since quitting: 30.8  . Smokeless  tobacco: Never  Vaping Use  . Vaping status: Never Used  Substance Use Topics  . Alcohol use: Never  . Drug use: Never    Past Surgical History:  Procedure Laterality Date  . CESAREAN SECTION     x 3  . [place]OSCOPY  07/04/2013   [place]   . removal of ovarian cyst  08/04/1982  . TONSILECTOMY/ADENOIDECTOMY WITH MYRINGOTOMY    . TUBAL LIGATION    . WISDOM TOOTH EXTRACTION      Family History  Problem Relation Age of Onset  . Alzheimer's disease Mother   . Heart disease Father 44  . Ovarian cancer Sister 48  . Colon cancer Sister 83       mets from ovaries  . Pancreatic cancer Sister        mets from ovaries  . Lung cancer Sister        mets from ovaries  . Liver cancer Sister        mets from ovaries  . Heart disease Brother 50 - 80  . Colon polyps Brother 31  . Colon polyps Brother 33    Allergies  Allergen Reactions  . Penicillins Hives    Current Medications:   Current Outpatient Medications:  SABRA  MAGNESIUM PO, Take by mouth. (Patient not taking: Reported on 04/08/2024), Disp: , Rfl:  .  MAGNESIUM-ZINC PO, Take 1 tablet by mouth daily at 6 (six) AM. (Patient not taking: Reported on 04/08/2024), Disp: ,  Rfl:  .  VITAMIN D  PO, Take 5,000 Units by mouth daily at 6 (six) AM. (Patient not taking: Reported on 04/08/2024), Disp: , Rfl:  .  VITAMIN K PO, Take 1 tablet by mouth daily at 6 (six) AM. (Patient not taking: Reported on 04/08/2024), Disp: , Rfl:    Review of Systems:   Negative unless otherwise specified per HPI.  Vitals:   Vitals:   04/08/24 1057  BP: 136/80  Pulse: 62  Temp: (!) 97.2 F (36.2 C)  TempSrc: Temporal  SpO2: 98%  Weight: 192 lb 4 oz (87.2 kg)  Height: 5' 2 (1.575 m)     Body mass index is 35.16 kg/m.  Physical Exam:   Physical Exam Vitals and nursing note reviewed.  Constitutional:      General: She is not in acute distress.    Appearance: She is well-developed. She is not ill-appearing or toxic-appearing.  Cardiovascular:      Rate and Rhythm: Normal rate and regular rhythm.     Pulses: Normal pulses.     Heart sounds: Normal heart sounds, S1 normal and S2 normal.  Pulmonary:     Effort: Pulmonary effort is normal.     Breath sounds: Normal breath sounds.  Skin:    General: Skin is warm and dry.     Comments: Bilateral arms with erythematous blisters; diffuse erythematous maculopapular rash to upper chest  Neurological:     Mental Status: She is alert.     GCS: GCS eye subscore is 4. GCS verbal subscore is 5. GCS motor subscore is 6.  Psychiatric:        Speech: Speech normal.        Behavior: Behavior normal. Behavior is cooperative.     Assessment and Plan:   Assessment and Plan Assessment & Plan Rash and nonspecific skin reaction Recurrent dermatitis with systemic symptoms, exacerbated by high-histamine foods and potential allergens. Symptoms include rash and throat irritation, suggesting systemic involvement. - Administered steroid injection for rapid symptom control. - Prescribed low-dose oral prednisone  for 1-2 weeks to prevent rebound rash. - Continue Claritin daily for antihistamine effect. - Advised Benadryl as needed for additional symptom control. - Recommended Pepcid as an additional antihistamine. - Refilled EpiPen  prescription for emergency use. - Referred to allergist for comprehensive allergy testing. - Advised taking pictures of the rash for allergist evaluation. - Instructed to seek emergency care if symptoms worsen, particularly with throat swelling or difficulty breathing.     Lucie Buttner, PA-C

## 2024-08-01 ENCOUNTER — Other Ambulatory Visit: Payer: Self-pay | Admitting: Medical Genetics

## 2024-08-08 ENCOUNTER — Ambulatory Visit: Admitting: Family Medicine

## 2024-08-08 VITALS — BP 152/84 | HR 64 | Temp 97.7°F | Ht 62.0 in | Wt 199.6 lb

## 2024-08-08 DIAGNOSIS — Z0000 Encounter for general adult medical examination without abnormal findings: Secondary | ICD-10-CM

## 2024-08-08 DIAGNOSIS — K219 Gastro-esophageal reflux disease without esophagitis: Secondary | ICD-10-CM

## 2024-08-08 DIAGNOSIS — Z131 Encounter for screening for diabetes mellitus: Secondary | ICD-10-CM | POA: Diagnosis not present

## 2024-08-08 DIAGNOSIS — Z13220 Encounter for screening for lipoid disorders: Secondary | ICD-10-CM | POA: Diagnosis not present

## 2024-08-08 DIAGNOSIS — E559 Vitamin D deficiency, unspecified: Secondary | ICD-10-CM | POA: Diagnosis not present

## 2024-08-08 LAB — POC URINALSYSI DIPSTICK (AUTOMATED)
Bilirubin, UA: NEGATIVE
Blood, UA: NEGATIVE
Glucose, UA: NEGATIVE
Ketones, UA: 3
Leukocytes, UA: NEGATIVE
Nitrite, UA: NEGATIVE
Protein, UA: NEGATIVE
Spec Grav, UA: 1.015
Urobilinogen, UA: NEGATIVE U/dL — AB
pH, UA: 5.5

## 2024-08-08 NOTE — Patient Instructions (Addendum)
 Schedule mammogram  Ideally get pneumonia, shingles, Tdap  Check bp's

## 2024-08-08 NOTE — Progress Notes (Signed)
 " Phone: [phone]   Subjective:  Patient presents today for their Welcome to Medicare Exam    Preventive Screening-Counseling & Management  Vision screen: declined d/t time constraints Hearing Screening   500Hz  1000Hz  2000Hz  4000Hz   Right ear Pass Pass Pass Pass  Left ear Pass Pass Fail Pass   Vision Screening (Inadequate exam)    Discussed the use of AI scribe software for clinical note transcription with the patient, who gave verbal consent to proceed.  History of Present Illness Carla Washington is a 69 year old female who presents for a Welcome to Medicare visit and concerns about H. Pylori.  She is here for her Welcome to Medicare visit but was not aware of the specific requirements and duration, which include vision, hearing, and EKG tests. Due to time constraints, she is considering rescheduling the visit. But then changed and then son called and pt decided to proceed  She has concerns about H. Pylori due to past stomach issues, belching, and pain. She recently completed a 72-hour water fast to address these issues and is currently on a ketobiotic diet. She has not been on any medication for H. Pylori recently and was tested negative for it many years ago.  She reports higher than usual blood pressure readings after her fast, which she attributes to potential errors in measurement. She plans to monitor her blood pressure at home.  She is semi-retired, has three children and three grandchildren, and plans to use her Silver [employer] to stay active. She does not smoke, drink, or use drugs.  No major headaches, dizziness, chest pains, or shortness of breath. She has joint pains and occasional heartburn related to overeating, which have improved since her fast. No urinary issues, vaginal bleeding, or muscle aches. She has not had a mammogram in years and is not interested in a colonoscopy or tetanus shot.    Advanced directives: +  Modifiable Risk  Factors/behavioral risk assessment/psychosocial risk assessment Regular exercise: not now Diet: working on it  [employer] Readings from Last 3 Encounters:  08/08/24 199 lb 9.6 oz (90.5 kg)  04/08/24 192 lb 4 oz (87.2 kg)  10/20/22 200 lb 6 oz (90.9 kg)   Smoking Status: former Never Smoker Second Hand Smoking status: noneNo smokers in home Alcohol intake: none per week Other substance abuse/illicit drugs: none  Cardiac risk factors:  advanced age (older than 62 for men, 82 for women) yes non Hyperlipidemia no no Hypertension no No diabetes. jpre Lab Results  Component Value Date   HGBA1C 5.9 10/20/2022   Family History: HPI  Family History  Problem Relation Age of Onset   Alzheimer's disease Mother    Heart disease Father 65   Ovarian cancer Sister 25   Colon cancer Sister 52       mets from ovaries   Pancreatic cancer Sister        mets from ovaries   Lung cancer Sister        mets from ovaries   Liver cancer Sister        mets from ovaries   Heart disease Brother 32 - 35   Colon polyps Brother 74   Colon polyps Brother 63    Depression Screen/risk evaluation Risk factors: no.. PHQ2 0 0    08/08/2024    2:17 PM 10/20/2022    3:36 PM 07/23/2021    2:08 PM 07/15/2019    1:09 PM  Depression screen PHQ 2/9  Decreased Interest 0 0 0 0  Down, Depressed, Hopeless 0 0 0 0  PHQ - 2 Score 0 0 0 0  Altered sleeping  1    Tired, decreased energy  0    Change in appetite  1    Feeling bad or failure about yourself   0    Trouble concentrating  0    Moving slowly or fidgety/restless  0    Suicidal thoughts  0    PHQ-9 Score  2     Difficult doing work/chores  Not difficult at all       Data saved with a previous flowsheet row definition    Functional ability and level of safety Mobility assessment: normal timed get up and go <12 seconds Activities of Daily Living- Independent in ADLs (toileting, bathing, dressing, transferring, eating) and in IADLs (shopping, housekeeping,  managing own medications, and handling finances)independent Home Safety: Loose rugs (none), smoke detectors (+), small pets (Yes), grab bars (none), stairs (none), life-alert system (none) Hearing Difficulties: -patient declines Fall Risk: None     08/08/2024    2:17 PM 08/08/2024    1:11 PM 10/20/2022    3:36 PM 07/23/2021    2:08 PM 12/22/2019    8:05 AM  Fall Risk   Falls in the past year? 0 0  0 0 0  Number falls in past yr: 0  0 0   Injury with Fall? 0  0  0    Risk for fall due to :   No Fall Risks No Fall Risks No Fall Risks  Follow up Falls evaluation completed  Falls evaluation completed Falls evaluation completed       Manually entered by patient   Data saved with a previous flowsheet row definition   Opioid use history: non no long term opioids use Self assessment of health status: good  Required Immunizations needed today:  declines  There is no immunization history on file for this patient. Health Maintenance  Topic Date Due   Medicare Annual Wellness Visit  Never done   Breast Cancer Screening  10/28/2022   Flu Shot  11/01/2024*   Zoster (Shingles) Vaccine (1 of 2) 11/06/2024*   DTaP/Tdap/Td vaccine (1 - Tdap) 08/08/2025*   Pneumococcal Vaccine for age over 33 (1 of 1 - PCV) 08/08/2025*   Colon Cancer Screening  08/08/2025*   Osteoporosis screening with Bone Density Scan  Completed   Hepatitis C Screening  Completed   Meningitis B Vaccine  Aged Out   COVID-19 Vaccine  Discontinued  *Topic was postponed. The date shown is not the original due date.    Screening tests-  Health Maintenance Due  Topic Date Due   Medicare Annual Wellness (AWV)  Never done   Mammogram  10/28/2022   Colon cancer screening- declines Lung Cancer screening- n/a Skin cancer screening- wear sunscreen Prostate cancer screening No results found for: PSA  4. Cervical cancer screening- due 2027 5. Breast cancer screening- sch  The following were reviewed and entered/updated in  epic: Past Medical History:  Diagnosis Date   Abnormal C-reactive protein 10/14/2015   B12 deficiency 06/27/2018   on meds   Carotid bruit 11/01/2016   HLD (hyperlipidemia) 06/27/2018   diet controlled   Obesity 11/01/2016   Osteopenia 08/04/2009   DEXA 08/04/2009   Prediabetes 10/14/2015   Systolic murmur 11/01/2016   hx of   Vitamin D  deficiency 06/27/2018   on meds   Patient Active Problem List   Diagnosis Date Noted   Family history of  colon cancer 10/22/2020   Beta thalassemia (HCC) 09/13/2020   Palpitations 06/16/2019   Snoring 06/16/2019   Vitamin D  deficiency 06/27/2018   B12 deficiency 06/27/2018   HLD (hyperlipidemia) 06/27/2018   Past Surgical History:  Procedure Laterality Date   CESAREAN SECTION     x 3   COLONOSCOPY  07/04/2013   [place]    removal of ovarian cyst  08/04/1982   TONSILECTOMY/ADENOIDECTOMY WITH MYRINGOTOMY     TUBAL LIGATION     WISDOM TOOTH EXTRACTION      Family History  Problem Relation Age of Onset   Alzheimer's disease Mother    Heart disease Father 71   Ovarian cancer Sister 48   Colon cancer Sister 56       mets from ovaries   Pancreatic cancer Sister        mets from ovaries   Lung cancer Sister        mets from ovaries   Liver cancer Sister        mets from ovaries   Heart disease Brother 7 - 59   Colon polyps Brother 14   Colon polyps Brother 51    Medications- reviewed and updated Current Outpatient Medications  Medication Sig Dispense Refill   EPINEPHrine  (EPIPEN  2-PAK) 0.3 mg/0.3 mL IJ SOAJ injection Inject 0.3 mg into the muscle as needed for anaphylaxis. (Patient not taking: Reported on 08/08/2024) 1 each 1   VITAMIN D  PO Take 5,000 Units by mouth daily at 6 (six) AM. (Patient not taking: Reported on 08/08/2024)     VITAMIN K PO Take 1 tablet by mouth daily at 6 (six) AM. (Patient not taking: Reported on 08/08/2024)     No current facility-administered medications for this visit.    Allergies-reviewed and  updated Allergies[1]  Social History   Socioeconomic History   Marital status: Divorced    Spouse name: Not on file   Number of children: 3   Years of education: Not on file   Highest education level: Bachelor's degree (e.g., BA, AB, BS)  Occupational History    Employer: [employer]   Occupation: multimedia programmer  Tobacco Use   Smoking status: Former    Current packs/day: 0.00    Types: Cigarettes    Quit date: 06/25/1993    Years since quitting: 31.1   Smokeless tobacco: Never  Vaping Use   Vaping status: Never Used  Substance and Sexual Activity   Alcohol use: Never   Drug use: Never   Sexual activity: Not Currently    Birth control/protection: Post-menopausal  Other Topics Concern   Not on file  Social History Narrative   3 grands   Social Drivers of Health   Tobacco Use: Medium Risk (08/08/2024)   Patient History    Smoking Tobacco Use: Former    Smokeless Tobacco Use: Never    Passive Exposure: Not on Actuary Strain: Low Risk (08/08/2024)   Overall Financial Resource Strain (CARDIA)    Difficulty of Paying Living Expenses: Not hard at all  Food Insecurity: No Food Insecurity (08/08/2024)   Epic    Worried About Radiation Protection Practitioner of Food in the Last Year: Never true    Ran Out of Food in the Last Year: Never true  Transportation Needs: No Transportation Needs (08/08/2024)   Epic    Lack of Transportation (Medical): No    Lack of Transportation (Non-Medical): No  Physical Activity: Sufficiently Active (08/08/2024)   Exercise Vital Sign    Days of  Exercise per Week: 3 days    Minutes of Exercise per Session: 50 min  Stress: Stress Concern Present (08/08/2024)   [employer] of Occupational Health - Occupational Stress Questionnaire    Feeling of Stress: To some extent  Social Connections: Socially Isolated (08/08/2024)   Social Connection and Isolation Panel    Frequency of Communication with Friends and Family: Twice a week    Frequency of  Social Gatherings with Friends and Family: Twice a week    Attends Religious Services: Patient declined    Database Administrator or Organizations: No    Attends Engineer, Structural: Not on file    Marital Status: Divorced  Depression (PHQ2-9): Low Risk (08/08/2024)   Depression (PHQ2-9)    PHQ-2 Score: 0  Alcohol Screen: Not on file  Housing: Unknown (08/08/2024)   Epic    Unable to Pay for Housing in the Last Year: No    Number of Times Moved in the Last Year: Not on file    Homeless in the Last Year: No  Utilities: Not on file  Health Literacy: Not on file   Objective  Objective:  BP (!) 152/84   Pulse 64   Temp 97.7 F (36.5 C)   Ht 5' 2 (1.575 m)   Wt 199 lb 9.6 oz (90.5 kg)   SpO2 97%   BMI 36.51 kg/m  Gen: NAD, resting comfortably HEENT: Mucous membranes are moist. Oropharynx normal.  Smells of ketones Neck: no thyromegaly CV: RRR no murmurs rubs or gallops Lungs: CTAB no crackles, wheeze, rhonchi Abdomen: soft/nontender/nondistended/normal bowel sounds. No rebound or guarding.  Ext: no edema Skin: warm, dry Neuro: grossly normal, moves all extremities, PERRLA   Results for orders placed or performed in visit on 08/08/24  POCT Urinalysis Dipstick (Automated)   Collection Time: 08/08/24  3:49 PM  Result Value Ref Range   Color, UA clear    Clarity, UA clear    Glucose, UA Negative Negative   Bilirubin, UA Negative    Ketones, UA 3    Spec Grav, UA 1.015 1.010 - 1.025   Blood, UA Negative    pH, UA 5.5 5.0 - 8.0   Protein, UA Negative Negative   Urobilinogen, UA negative (A) 0.2 or 1.0 E.U./dL   Nitrite, UA Negative    Leukocytes, UA Negative Negative    Assessment and Plan:   Welcome to Medicare exam completed-  Educated, counseled and referred based on above elements Educated, counseled and referred as appropriate for preventative needs Discussed and documented a written plan for preventiative services and screenings with personalized health  advice- After Visit Summary was given to patient which included this plan + 4. EKG offered [id number]- patient +  Status of chronic or acute concerns  good The 10-year ASCVD risk score Verdon DC Mickey., et al., 2013) is: 4%. Patient declines most medical intervention - vaccinations, medications. She agrees to mammogram q 5 years. Already had a colonoscopy with another planned after 10 years.  No problem-specific Assessment & Plan notes found for this encounter.   Recommended follow up: annual Future Appointments  Date Time Provider Department Center  09/21/2024 10:00 AM [facility] None  08/09/2025  9:00 AM [name], MD [facility]     Lab/Order associations:   ICD-10-CM   1. Welcome to Medicare preventive visit  Z00.00     2. Wellness examination  Z00.00 TSH    POCT Urinalysis Dipstick (Automated)    EKG  12-Lead    3. Screening for lipid disorders  Z13.220 Lipid panel    4. Screening for diabetes mellitus  Z13.1 Hemoglobin A1c    5. Vitamin D  deficiency  E55.9 VITAMIN D  25 Hydroxy (Vit-D Deficiency, Fractures)    6. Gastroesophageal reflux disease without esophagitis  K21.9 Comprehensive metabolic panel with GFR    CBC with Differential/Platelet    H. pylori breath test      No orders of the defined types were placed in this encounter. Assessment and Plan  EKG: nsr.  No st changes Assessment & Plan Gastroesophageal reflux disease   Intermittent heartburn and belching likely related to dietary habits. Symptoms improved after a 72-hour water fast. No current medication for GERD. Continue dietary modifications to manage symptoms.  Vitamin D  deficiency   Previous low levels with no current supplementation. Ordered vitamin D  level test.  General Health Maintenance   Routine health maintenance discussed, including screenings and vaccinations. She declined tetanus, pneumonia, and shingles vaccines. Pap smear due in 2027. Mammogram is overdue.  Colonoscopy declined. Encouraged regular exercise, including gym workouts three times a week, and discussed the importance of maintaining a healthy diet and lifestyle. Schedule mammogram. Aware of medicare rules for labs.  Willing to pay cash    Return precautions advised. Return in about 1 year (around 08/08/2025) for annual physical. Jenkins CHRISTELLA Carrel, MD     [1]  Allergies Allergen Reactions   Penicillins Hives   "

## 2024-08-09 ENCOUNTER — Ambulatory Visit: Payer: Self-pay | Admitting: Family Medicine

## 2024-08-09 ENCOUNTER — Encounter: Payer: Self-pay | Admitting: Family Medicine

## 2024-08-09 LAB — CBC WITH DIFFERENTIAL/PLATELET
Basophils Absolute: 0.1 K/uL (ref 0.0–0.1)
Basophils Relative: 1 % (ref 0.0–3.0)
Eosinophils Absolute: 0.2 K/uL (ref 0.0–0.7)
Eosinophils Relative: 2.7 % (ref 0.0–5.0)
HCT: 40.8 % (ref 36.0–46.0)
Hemoglobin: 13.4 g/dL (ref 12.0–15.0)
Lymphocytes Relative: 30.3 % (ref 12.0–46.0)
Lymphs Abs: 1.7 K/uL (ref 0.7–4.0)
MCHC: 32.8 g/dL (ref 30.0–36.0)
MCV: 78.8 fl (ref 78.0–100.0)
Monocytes Absolute: 0.6 K/uL (ref 0.1–1.0)
Monocytes Relative: 10.1 % (ref 3.0–12.0)
Neutro Abs: 3.2 K/uL (ref 1.4–7.7)
Neutrophils Relative %: 55.9 % (ref 43.0–77.0)
Platelets: 264 K/uL (ref 150.0–400.0)
RBC: 5.18 Mil/uL — ABNORMAL HIGH (ref 3.87–5.11)
RDW: 13.9 % (ref 11.5–15.5)
WBC: 5.7 K/uL (ref 4.0–10.5)

## 2024-08-09 LAB — LIPID PANEL
Cholesterol: 291 mg/dL — ABNORMAL HIGH (ref 28–200)
HDL: 58.4 mg/dL
LDL Cholesterol: 208 mg/dL — ABNORMAL HIGH (ref 10–99)
NonHDL: 232.57
Total CHOL/HDL Ratio: 5
Triglycerides: 125 mg/dL (ref 10.0–149.0)
VLDL: 25 mg/dL (ref 0.0–40.0)

## 2024-08-09 LAB — HEMOGLOBIN A1C: Hgb A1c MFr Bld: 5.7 % (ref 4.6–6.5)

## 2024-08-09 LAB — H. PYLORI BREATH TEST

## 2024-08-09 LAB — COMPREHENSIVE METABOLIC PANEL WITH GFR
ALT: 26 U/L (ref 3–35)
AST: 34 U/L (ref 5–37)
Albumin: 4.5 g/dL (ref 3.5–5.2)
Alkaline Phosphatase: 71 U/L (ref 39–117)
BUN: 16 mg/dL (ref 6–23)
CO2: 24 meq/L (ref 19–32)
Calcium: 9.4 mg/dL (ref 8.4–10.5)
Chloride: 99 meq/L (ref 96–112)
Creatinine, Ser: 0.8 mg/dL (ref 0.40–1.20)
GFR: 75.99 mL/min
Glucose, Bld: 71 mg/dL (ref 70–99)
Potassium: 4 meq/L (ref 3.5–5.1)
Sodium: 135 meq/L (ref 135–145)
Total Bilirubin: 0.8 mg/dL (ref 0.2–1.2)
Total Protein: 7.3 g/dL (ref 6.0–8.3)

## 2024-08-09 LAB — VITAMIN D 25 HYDROXY (VIT D DEFICIENCY, FRACTURES): VITD: 46.34 ng/mL (ref 30.00–100.00)

## 2024-08-09 LAB — TSH: TSH: 1.83 u[IU]/mL (ref 0.35–5.50)

## 2024-08-09 NOTE — Progress Notes (Signed)
"   H pylori didn't get good sample.  Please sch to return to complete again A1C(3 month average of sugars) is elevated.  This may be considered PreDiabetes.  Work on diet-decrease sugars and starches and aim for 30 minutes of exercise 5 days/week to prevent progression to diabetes  Cholesterol really bad.  Schedule appt to discuss treatment(can be virtual) "

## 2024-08-10 ENCOUNTER — Telehealth: Payer: Self-pay

## 2024-08-10 NOTE — Telephone Encounter (Signed)
 Called pt and notified hearing and EKG were WNL  Copied from CRM #[id number]. Topic: Clinical - Lab/Test Results >> Aug 10, 2024  1:54 PM Suzen RAMAN wrote: Reason for CRM: patient has not received her EKG or hearing test results and wanted to make provider aware.

## 2024-08-11 ENCOUNTER — Other Ambulatory Visit: Payer: Self-pay

## 2024-08-11 DIAGNOSIS — E782 Mixed hyperlipidemia: Secondary | ICD-10-CM

## 2024-08-11 NOTE — Progress Notes (Signed)
 Called pt notified she said last year the Hplyori was neg so wants to know should she retake

## 2024-08-12 NOTE — Progress Notes (Signed)
 Called pt and notified smk

## 2024-09-21 ENCOUNTER — Other Ambulatory Visit (HOSPITAL_COMMUNITY): Payer: Self-pay

## 2025-08-09 ENCOUNTER — Encounter: Admitting: Family Medicine
